# Patient Record
Sex: Female | Born: 1990 | Race: Black or African American | Hispanic: No | Marital: Single | Smoking: Never smoker
Health system: Southern US, Community
[De-identification: ages and names within clinical notes are randomized; demographics above are authoritative.]

## PROBLEM LIST (undated history)

## (undated) DIAGNOSIS — T7840XA Allergy, unspecified, initial encounter: Secondary | ICD-10-CM

## (undated) DIAGNOSIS — R9431 Abnormal electrocardiogram [ECG] [EKG]: Secondary | ICD-10-CM

## (undated) HISTORY — DX: Abnormal electrocardiogram (ECG) (EKG): R94.31

## (undated) HISTORY — PX: SHOULDER SURGERY: SHX246

## (undated) HISTORY — DX: Allergy, unspecified, initial encounter: T78.40XA

## (undated) HISTORY — PX: ACHILLES TENDON SURGERY: SHX542

---
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
## 2010-12-23 LAB — URINALYSIS, ROUTINE W REFLEX MICROSCOPIC
Bilirubin Urine: NEGATIVE
Glucose, UA: NEGATIVE
Hgb urine dipstick: NEGATIVE
Protein, ur: NEGATIVE

## 2010-12-23 LAB — WET PREP, GENITAL
Clue Cells Wet Prep HPF POC: NONE SEEN
Trich, Wet Prep: NONE SEEN

## 2010-12-23 LAB — POCT PREGNANCY, URINE
Operator id: 277751
Preg Test, Ur: NEGATIVE

## 2010-12-23 LAB — URINE MICROSCOPIC-ADD ON

## 2010-12-23 LAB — GC/CHLAMYDIA PROBE AMP, GENITAL
Chlamydia, DNA Probe: NEGATIVE
GC Probe Amp, Genital: POSITIVE — AB

## 2010-12-23 LAB — URINE CULTURE

## 2011-03-20 DIAGNOSIS — Z0000 Encounter for general adult medical examination without abnormal findings: Secondary | ICD-10-CM

## 2011-03-20 HISTORY — DX: Encounter for general adult medical examination without abnormal findings: Z00.00

## 2011-03-25 ENCOUNTER — Other Ambulatory Visit: Payer: Self-pay | Admitting: Orthopedic Surgery

## 2011-03-25 ENCOUNTER — Ambulatory Visit
Admission: RE | Admit: 2011-03-25 | Discharge: 2011-03-25 | Disposition: A | Discharge status: Home / Self Care | Payer: BC Managed Care – PPO | Source: Ambulatory Visit | Attending: Orthopedic Surgery | Admitting: Orthopedic Surgery

## 2011-03-25 DIAGNOSIS — S43006A Unspecified dislocation of unspecified shoulder joint, initial encounter: Secondary | ICD-10-CM

## 2011-06-15 ENCOUNTER — Emergency Department (HOSPITAL_COMMUNITY): Payer: BC Managed Care – PPO

## 2011-06-15 ENCOUNTER — Encounter (HOSPITAL_COMMUNITY): Payer: Self-pay | Admitting: Adult Health

## 2011-06-15 ENCOUNTER — Emergency Department (HOSPITAL_COMMUNITY)
Admission: EM | Admit: 2011-06-15 | Discharge: 2011-06-16 | Disposition: A | Discharge status: Home / Self Care | Payer: BC Managed Care – PPO | Attending: Emergency Medicine | Admitting: Emergency Medicine

## 2011-06-15 ENCOUNTER — Other Ambulatory Visit: Payer: Self-pay

## 2011-06-15 DIAGNOSIS — R072 Precordial pain: Secondary | ICD-10-CM | POA: Insufficient documentation

## 2011-06-15 DIAGNOSIS — R011 Cardiac murmur, unspecified: Secondary | ICD-10-CM | POA: Insufficient documentation

## 2011-06-15 MED ORDER — IOHEXOL 350 MG/ML SOLN
100.0000 mL | Freq: Once | INTRAVENOUS | Status: AC | PRN
  Administered 2011-06-15: 100 mL via INTRAVENOUS

## 2011-06-15 MED ORDER — OXYCODONE-ACETAMINOPHEN 5-325 MG PO TABS
1.0000 | ORAL_TABLET | Freq: Once | ORAL | Status: AC
  Administered 2011-06-15: 1 via ORAL
  Filled 2011-06-15: qty 1

## 2011-06-15 MED ORDER — ONDANSETRON HCL 4 MG/2ML IJ SOLN
4.0000 mg | Freq: Once | INTRAMUSCULAR | Status: DC
  Filled 2011-06-15: qty 2

## 2011-06-15 MED ORDER — IOHEXOL 300 MG/ML  SOLN: 100.0000 mL | Freq: Once | INTRAMUSCULAR | Status: AC | PRN

## 2011-06-15 MED ORDER — MORPHINE SULFATE 4 MG/ML IJ SOLN
4.0000 mg | Freq: Once | INTRAMUSCULAR | Status: DC
  Filled 2011-06-15: qty 1

## 2011-06-15 NOTE — ED Provider Notes (Signed)
History     CSN: [id number]  Arrival date & time 06/15/11  2017   First MD Initiated Contact with Patient 06/15/11 2049      Chief Complaint  Patient presents with  . Chest Pain    (Consider location/radiation/quality/duration/timing/severity/associated sxs/prior treatment) HPI Comments: Patient with a significant medical history presents emergency department with chief complaint of chest pain.  His pain is located substernally is rated at 9/10 in severity, does not radiate, is constant and pressure-like in nature with no associated symptoms including syncope, dizziness, diaphoresis, shortness of breath, or DOE.  Patient states the onset of chest pain was about a week ago and was intermittent in nature but has gradually worsened becoming constant as of today.  Patient states she is very athletic and played basketball.  She denies being evaluated by a cardiologist in the past.  She has no history of smoking, anxiety, asthma, seizures, syncope or family history of Marfan's.  Patient has no other complaints at this time.  Patient is a 21 y.o. female presenting with chest pain. The history is provided by the patient.  Chest Pain Pertinent negatives for primary symptoms include no fever, no fatigue, no shortness of breath, no cough, no wheezing, no palpitations, no abdominal pain, no nausea, no vomiting and no dizziness.  Pertinent negatives for associated symptoms include no diaphoresis.     History reviewed. No pertinent past medical history.  History reviewed. No pertinent past surgical history.  History reviewed. No pertinent family history.  History  Substance Use Topics  . Smoking status: Never Smoker   . Smokeless tobacco: Not on file  . Alcohol Use: No    OB History    Grav Para Term Preterm Abortions TAB SAB Ect Mult Living                  Review of Systems  Constitutional: Negative for fever, chills, diaphoresis, activity change, fatigue and unexpected weight change.   HENT: Negative for congestion, neck pain and neck stiffness.   Eyes: Negative for visual disturbance.  Respiratory: Negative for apnea, cough, chest tightness, shortness of breath, wheezing and stridor.   Cardiovascular: Positive for chest pain. Negative for palpitations and leg swelling.  Gastrointestinal: Negative for nausea, vomiting, abdominal pain, diarrhea and blood in stool.  Genitourinary: Negative for dysuria, urgency, hematuria and flank pain.  Musculoskeletal: Negative for myalgias, back pain and gait problem.  Skin: Negative for pallor.  Neurological: Negative for dizziness, syncope, light-headedness and headaches.  All other systems reviewed and are negative.    Allergies  Review of patient's allergies indicates no known allergies.  Home Medications   Current Outpatient Rx  Name Route Sig Dispense Refill  . IBUPROFEN 400 MG PO TABS Oral Take 400 mg by mouth every 6 (six) hours as needed. For pain      BP 118/78  Pulse 43  Resp 15  SpO2 100%  LMP 06/14/2011  Physical Exam  Nursing note and vitals reviewed. Constitutional: She appears well-developed and well-nourished. No distress.  HENT:  Head: Normocephalic and atraumatic.  Eyes: Conjunctivae and EOM are normal. Pupils are equal, round, and reactive to light.  Neck: Normal range of motion. Neck supple. Normal carotid pulses and no JVD present. Carotid bruit is not present. No rigidity. Normal range of motion present.  Cardiovascular: Normal rate, regular rhythm, S1 normal, S2 normal, normal heart sounds, intact distal pulses and normal pulses.  Exam reveals no gallop and no friction rub.   No murmur heard.  No pitting edema bilaterally, RRR, 2/5 systolic ejection murmur heard best at the apex of the heart, distal pulses intact, no carotid bruit or JVD.   Pulmonary/Chest: Effort normal and breath sounds normal. No accessory muscle usage or stridor. No respiratory distress. She exhibits no tenderness and no  bony tenderness.  Abdominal: Bowel sounds are normal.       Soft non tender. Non pulsatile aorta.   Skin: Skin is warm, dry and intact. No rash noted. She is not diaphoretic. No cyanosis. Nails show no clubbing.    ED Course  Procedures (including critical care time)  Labs Reviewed - No data to display Dg Chest 2 View  06/15/2011  *RADIOLOGY REPORT*  Clinical Data: Chest pain  CHEST - 2 VIEW  Comparison: None  Findings: Normal heart size, mediastinal contours, and pulmonary vascularity. Lungs minimally hyperaerated clear. No pleural effusion or pneumothorax. Bones unremarkable.  IMPRESSION: No acute abnormalities.  Original Report Authenticated By: Lollie Marrow, M.D.   Ct Angio Chest W/cm &/or Wo Cm  06/15/2011  *RADIOLOGY REPORT*  Clinical Data:  Chest pain  CT ANGIOGRAPHY CHEST AND ABDOMEN  Technique:  Multidetector CT imaging of the chest and abdomen was performed using the standard protocol during bolus administration of intravenous contrast.  Multiplanar CT image reconstructions including MIPs were obtained to evaluate the vascular anatomy.  Contrast: 1 OMNIPAQUE IOHEXOL 300 MG/ML IJ SOLN, OMNIPAQUE IOHEXOL 350 MG/ML IV SOLN  Comparison:   None.  CTA CHEST  No enlarged supraclavicular or axillary lymph nodes.  There is no enlarged mediastinal or hilar lymph nodes.  No pericardial or pleural effusion identified.  The pulmonary arteries are patent.  There is no evidence for an acute pulmonary embolus.  No pericardial or pleural effusions identified.  The lungs appear clear.  No airspace consolidation, atelectasis or pneumothorax present.  The tracheobronchial tree is normal.  Normal appearance of the thoracic aorta.  There is no evidence for dissection.  Review of the visualized bony structures is negative.  IMPRESSION:  1.  No acute findings.  There is no evidence for aortic dissection or acute pulmonary embolus.  CTA ABDOMEN  Findings:  Visualized lung bases are clear.  Liver is normal.   Spleen is normal.  Pancreas is normal.  The adrenal glands are normal.   The kidneys are normal.  Gallbladder is unremarkable by CT.  No biliary ductal dilation.  Stomach and visualized large and small bowel are unremarkable.  Abdominal aorta is normal in caliber.  No evidence for dissection.  No significant lymphadenopathy.  No free fluid or abnormal fluid collections.   Review of the MIP images confirms the above findings.  IMPRESSION:  1.  No evidence for abdominal aortic aneurysm or dissection.  Original Report Authenticated By: Rosealee Albee, M.D.   Ct Angio Abdomen W/cm &/or Wo Contrast  06/15/2011  *RADIOLOGY REPORT*  Clinical Data:  Chest pain  CT ANGIOGRAPHY CHEST AND ABDOMEN  Technique:  Multidetector CT imaging of the chest and abdomen was performed using the standard protocol during bolus administration of intravenous contrast.  Multiplanar CT image reconstructions including MIPs were obtained to evaluate the vascular anatomy.  Contrast: 1 OMNIPAQUE IOHEXOL 300 MG/ML IJ SOLN, OMNIPAQUE IOHEXOL 350 MG/ML IV SOLN  Comparison:   None.  CTA CHEST  No enlarged supraclavicular or axillary lymph nodes.  There is no enlarged mediastinal or hilar lymph nodes.  No pericardial or pleural effusion identified.  The pulmonary arteries are patent.  There is  no evidence for an acute pulmonary embolus.  No pericardial or pleural effusions identified.  The lungs appear clear.  No airspace consolidation, atelectasis or pneumothorax present.  The tracheobronchial tree is normal.  Normal appearance of the thoracic aorta.  There is no evidence for dissection.  Review of the visualized bony structures is negative.  IMPRESSION:  1.  No acute findings.  There is no evidence for aortic dissection or acute pulmonary embolus.  CTA ABDOMEN  Findings:  Visualized lung bases are clear.  Liver is normal.  Spleen is normal.  Pancreas is normal.  The adrenal glands are normal.   The kidneys are normal.  Gallbladder is  unremarkable by CT.  No biliary ductal dilation.  Stomach and visualized large and small bowel are unremarkable.  Abdominal aorta is normal in caliber.  No evidence for dissection.  No significant lymphadenopathy.  No free fluid or abnormal fluid collections.   Review of the MIP images confirms the above findings.  IMPRESSION:  1.  No evidence for abdominal aortic aneurysm or dissection.  Original Report Authenticated By: Rosealee Albee, M.D.     No diagnosis found.   Date: 06/15/2011  Rate: 45  Rhythm: normal sinus rhythm  QRS Axis: borderline right axis deviation   Intervals: normal  ST/T Wave abnormalities: normal  Conduction Disutrbances:none  Narrative Interpretation: LVH   Old EKG Reviewed: none available     MDM  Chest pain, concern for hypertrophic cardiomyopathy   Pts EKG, CTangio Chest/Abd and CXR have been reviewed without evidence of acute cardiopulmonary etiology, dissection or aneurysm.  Cardiology will be consult it to schedule followup and echo as an outpatient.  Patient has been instructed to have 0 exercise until she has had her cardiac evaluation. Pts pain managed in ED with Percocet.  Patient verbalizes understanding and is agreeable with plan.  Spoke with Dr. Terressa Koyanagi, cardiology  Who will see pt as an OP in his office this week for further evaluation.         Jaci Carrel, [place] 06/15/11 2337

## 2011-06-15 NOTE — Discharge Instructions (Signed)
CAll the heart doctor listed above tomorrow morning to schedule an appointment for further evaluation this week. Do not exercise until cleared by the cardiologist. Read instructions below for reasons to return to the Emergency Department.    Chest Pain (Nonspecific)  HOME CARE INSTRUCTIONS  For the next few days, avoid physical activities that bring on chest pain. Continue physical activities as directed.  Do not smoke cigarettes or drink alcohol until your symptoms are gone.  Only take over-the-counter or prescription medicine for pain, discomfort, or fever as directed by your caregiver.  Follow your caregiver's suggestions for further testing if your chest pain does not go away.  Keep any follow-up appointments you made. If you do not go to an appointment, you could develop lasting (chronic) problems with pain. If there is any problem keeping an appointment, you must call to reschedule.  SEEK MEDICAL CARE IF:  You think you are having problems from the medicine you are taking. Read your medicine instructions carefully.  Your chest pain does not go away, even after treatment.  You develop a rash with blisters on your chest.  SEEK IMMEDIATE MEDICAL CARE IF:  You have increased chest pain or pain that spreads to your arm, neck, jaw, back, or belly (abdomen).  You develop shortness of breath, an increasing cough, or you are coughing up blood.  You have episodes of syncope (passing out, black out)  You have severe back or abdominal pain, feel sick to your stomach (nauseous) or throw up (vomit).  You develop severe weakness, fainting, or chills.  You have an oral temperature above 102 F (38.9 C), not controlled by medicine.   THIS IS AN EMERGENCY. Do not wait to see if the pain will go away. Get medical help at once. Call your local emergency services (911 in [place] Do not drive yourself to the hospital.   RESOURCE GUIDE  Dental Problems  Patients with Medicaid: [facility] [phone] [address]                                           [phone] [address] Phone:  [phone]                                                  Phone:  [phone]  If unable to pay or uninsured, contact:  [facility] or [facility]. to become qualified for the adult dental clinic.  Chronic Pain Problems Contact [facility]  [phone] Patients need to be referred by their primary care doctor.  Insufficient Money for Medicine Contact [employer]:  call "211" or [facility] (404)775-5310.  No Primary Care Doctor Call [facility]  671-623-8399 Other agencies that provide inexpensive medical care    [facility]  [phone]    [facility]  [phone]    [facility]  [phone]    [facility]  [phone]    [facility]  [phone]    [facility]  [phone]  Psychological Services [facility] Behavioral Health  [phone] [facility]  [phone]  [facility]   [phone] (emergency services [phone])  Substance Abuse Resources [facility] and Drug Services  [phone] [facility] [phone]) [phone] [facility] [phone] Floyd[facility] Flock [phone] Residential & Outpatient Substance Abuse Program  [phone]  Abuse/Neglect [facility] Child Abuse Hotline 801-783-7928 [facility] Child Abuse Hotline 616-540-9551 (After Hours)  Emergency Shelter [facility] [phone]  Maternity Homes Room at the [place] [facility] [phone] [facility] [phone]  MRSA Hotline #:   (302) 061-1376    [facility] Resources  [facility] [place]     [employer]                          [employer] Dept. [address] [place]                       [address]      [address] [place] [address]  Blondell Reveal Phone:  [phone]                                   Phone:  [phone]                 Phone:  [phone]  [facility] Phone:  [phone]  [facility] Child Abuse Hotline 6695401027 314-284-1604 (After Hours)

## 2011-06-15 NOTE — ED Notes (Addendum)
Pt was seen in [place] and told she may have an enl;arged heart. C/o of chest pain that is decribed as pressure and is constant and weakness for for the last 6-8 hours. HR is 41-50. pt is an athlete. Denies nausea, denies SOB. EKG completed.  mentating well, answers all questions

## 2011-06-15 NOTE — ED Notes (Signed)
PA, Paz at bedside.

## 2011-06-27 ENCOUNTER — Ambulatory Visit (INDEPENDENT_AMBULATORY_CARE_PROVIDER_SITE_OTHER): Payer: BC Managed Care – PPO | Admitting: Cardiology

## 2011-06-27 ENCOUNTER — Other Ambulatory Visit: Payer: Self-pay

## 2011-06-27 ENCOUNTER — Encounter: Payer: Self-pay | Admitting: Cardiology

## 2011-06-27 ENCOUNTER — Ambulatory Visit (HOSPITAL_COMMUNITY): Payer: BC Managed Care – PPO | Attending: Cardiology

## 2011-06-27 DIAGNOSIS — R079 Chest pain, unspecified: Secondary | ICD-10-CM | POA: Insufficient documentation

## 2011-06-27 DIAGNOSIS — R072 Precordial pain: Secondary | ICD-10-CM

## 2011-06-27 LAB — LIPID PANEL
Total CHOL/HDL Ratio: 2
Triglycerides: 33 mg/dL (ref 0.0–149.0)

## 2011-06-27 NOTE — Progress Notes (Signed)
21 yo with no significant past medical history presents for evaluation of chest pain.  For about 1.5 weeks, she had episodes of chest pressure, like someone grabbing her chest.  These episodes would occur several times a day and last for about 30 minutes at a time.  No trigger (not related to exertion, no related to meals).  Usually she would be at rest when they would start.  No tachypalpitations.  No syncope or lightheadedness.  No orthopnea.  Pain was not pleuritic.  She finally came to the ER a couple of weeks ago.  CTA chest and abdomen showed no PE, dissection, or gallbladder pathology.  ECG was normal.  She was sent home to followup in this office.  Since discharge from the ER, she has had only mild chest pressure.  This will resolve if she takes an aspirin.  She feels better overall.   ECG: NSR, normal  PMH:  No significant medical problems  FH: No heart problems in her family that she knows.  No SCD.   SH: Nonsmoker, no drugs.  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   ROS: All systems reviewed and negative except as per HPI.   Current Outpatient Prescriptions  Medication Sig Dispense Refill  . ibuprofen (ADVIL,MOTRIN) 400 MG tablet Take 400 mg by mouth every 6 (six) hours as needed. For pain        BP 110/68  Pulse 51  Ht 5\' 8"  (1.727 m)  Wt 139 lb 6.4 oz (63.231 kg)  BMI 21.20 kg/m2  LMP 06/14/2011 General: NAD Neck: No JVD, no thyromegaly or thyroid nodule.  Lungs: Clear to auscultation bilaterally with normal respiratory effort. CV: Nondisplaced PMI.  Heart regular S1/S2, no S3/S4, no murmur.  No peripheral edema.  No carotid bruit.  Normal pedal pulses.  No chest wall tenderness.  Abdomen: Soft, nontender, no hepatosplenomegaly, no distention.  Skin: Intact without lesions or rashes.  Neurologic: Alert and oriented x 3.  Psych: Normal affect. Extremities: No clubbing or cyanosis.  HEENT: Normal.

## 2011-06-27 NOTE — Assessment & Plan Note (Signed)
Atypical chest pain.  Not with exertion.  Not pleuritic or associated with meals.  Better with NSAIDs and aspirin.  ECG is normal.  NO family history of cardiac disease of any sort that she can recall.  She is a Chemical engineer.  I will have her get an echocardiogram.  If her heart is structurally normal, it will be ok for her to return to basketball practice.  I suspect that her symptoms were noncardiac.    Patient eats a very high fat and cholesterol diet.  I am going to check her lipids today as well.

## 2011-06-27 NOTE — Patient Instructions (Signed)
Please have fasting blood work today  The current medical regimen is effective;  continue present plan and medications.  Your physician has requested that you have an echocardiogram. Echocardiography is a painless test that uses sound waves to create images of your heart. It provides your doctor with information about the size and shape of your heart and how well your heart's chambers and valves are working. This procedure takes approximately one hour. There are no restrictions for this procedure.  Follow up as needed

## 2011-07-01 NOTE — ED Provider Notes (Signed)
Medical screening examination/treatment/procedure(s) were performed by non-physician practitioner and as supervising physician I was immediately available for consultation/collaboration.  Raeford Razor, MD 07/01/11 [phone]

## 2011-11-17 ENCOUNTER — Ambulatory Visit (INDEPENDENT_AMBULATORY_CARE_PROVIDER_SITE_OTHER): Payer: BC Managed Care – PPO | Admitting: Family Medicine

## 2011-11-17 VITALS — BP 132/80 | HR 50 | Temp 98.7°F | Resp 16 | Ht 68.0 in | Wt 141.0 lb

## 2011-11-17 DIAGNOSIS — Z0000 Encounter for general adult medical examination without abnormal findings: Secondary | ICD-10-CM

## 2011-11-17 DIAGNOSIS — Z124 Encounter for screening for malignant neoplasm of cervix: Secondary | ICD-10-CM

## 2011-11-17 DIAGNOSIS — Z23 Encounter for immunization: Secondary | ICD-10-CM

## 2011-11-17 LAB — COMPREHENSIVE METABOLIC PANEL
ALT: 12 U/L (ref 0–35)
Albumin: 4.3 g/dL (ref 3.5–5.2)
CO2: 26 mEq/L (ref 19–32)
Calcium: 9.4 mg/dL (ref 8.4–10.5)
Chloride: 104 mEq/L (ref 96–112)
Potassium: 3.7 mEq/L (ref 3.5–5.3)
Sodium: 137 mEq/L (ref 135–145)
Total Bilirubin: 0.7 mg/dL (ref 0.3–1.2)
Total Protein: 8 g/dL (ref 6.0–8.3)

## 2011-11-17 LAB — POCT CBC
Hemoglobin: 14 g/dL (ref 12.2–16.2)
MPV: 10.5 fL (ref 0–99.8)
POC Granulocyte: 3.3 (ref 2–6.9)
POC MID %: 8.1 %M (ref 0–12)
RBC: 4.59 M/uL (ref 4.04–5.48)

## 2011-11-17 NOTE — Progress Notes (Signed)
[facility] [address], [place] [address] [phone]  Date:  11/17/2011   Name:  Shelly Delacruz   DOB:  Aug 05, 1990   MRN:  [id number]  PCP:  No primary provider on file.    Chief Complaint: CPE   History of Present Illness:  Shelly Delacruz is a 21 y.o. very pleasant female patient who presents with the following:  Here today for a CPE.  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  She will graduate this year- then she plans to continue playing basketball or working in the sports field.  She has no concerns and feels fine.  She would like to have a pap today, no history of abnormal pap  She is not sure of the date of her last tetanus shot- thinks her last shot was as a child.   She is not currently SA- last SA several months ago.    Patient Active Problem List  Diagnosis  . Chest pain    Past Medical History  Diagnosis Date  . Chest pain   . Abnormal EKG     No past surgical history on file.  History  Substance Use Topics  . Smoking status: Never Smoker   . Smokeless tobacco: Not on file  . Alcohol Use: No    Family History  Problem Relation Age of Onset  . Hypertension Mother     No Known Allergies  Medication list has been reviewed and updated.  Current Outpatient Prescriptions on File Prior to Visit  Medication Sig Dispense Refill  . ibuprofen (ADVIL,MOTRIN) 400 MG tablet Take 400 mg by mouth every 6 (six) hours as needed. For pain        Review of Systems:  As per HPI- otherwise negative.   Physical Examination: Filed Vitals:   11/17/11 0806  BP: 132/80  Pulse: 50  Temp: 98.7 F (37.1 C)  Resp: 16   Filed Vitals:   11/17/11 0806  Height: 5\' 8"  (1.727 m)  Weight: 141 lb (63.957 kg)   Body mass index is 21.44 kg/(m^2). Ideal Body Weight: Weight in (lb) to have BMI = 25: 164.1   GEN: WDWN, NAD, Non-toxic, A & O x 3 HEENT: Atraumatic, Normocephalic. Neck supple. No masses, No LAD. PEERL, EOMI, TM and  oropharynx wnl.   Ears and Nose: No external deformity. CV: RRR, No M/G/R. No JVD. No thrill. No extra heart sounds. PULM: CTA B, no wheezes, crackles, rhonchi. No retractions. No resp. distress. No accessory muscle use. ABD: S, NT, ND, +BS. No rebound. No HSM. Breast Exam: normal, no masses or discharge GU: normal exam, normal external and internal genitalia, no CMT, no adnexal masses or tenderness, normal uterus.   EXTR: No c/c/e NEURO Normal gait.  PSYCH: Normally interactive. Conversant. Not depressed or anxious appearing.  Calm demeanor.    Assessment and Plan: 1. Physical exam, annual  POCT CBC, Comprehensive metabolic panel  2. Screening for cervical cancer  Pap IG, CT/NG w/ reflex HPV when ASC-U  3. Need for diphtheria-tetanus-pertussis (Tdap) vaccine, adult/adolescent  Tdap vaccine greater than or equal to 7yo IM   PE for age today.  Declined contraceptive Rx today.   Update Tdap. Labs pending as above- FLP done by cardiology in April of this year.  Looked great.  Will plan further follow- up pending labs. We wish Shelly Delacruz the best of luck in her senior year!    Abbe Amsterdam, MD

## 2011-11-18 ENCOUNTER — Encounter: Payer: Self-pay | Admitting: Family Medicine

## 2011-11-21 ENCOUNTER — Encounter: Payer: Self-pay | Admitting: Family Medicine

## 2011-11-21 LAB — PAP IG, CT-NG, RFX HPV ASCU: Chlamydia Probe Amp: NEGATIVE

## 2012-12-07 ENCOUNTER — Ambulatory Visit (INDEPENDENT_AMBULATORY_CARE_PROVIDER_SITE_OTHER): Payer: BC Managed Care – PPO | Admitting: Physician Assistant

## 2012-12-07 VITALS — BP 108/62 | HR 46 | Temp 98.0°F | Resp 18 | Ht 67.0 in | Wt 145.0 lb

## 2012-12-07 DIAGNOSIS — Z0000 Encounter for general adult medical examination without abnormal findings: Secondary | ICD-10-CM

## 2012-12-07 LAB — POCT URINALYSIS DIPSTICK
Ketones, UA: NEGATIVE
Leukocytes, UA: NEGATIVE
Protein, UA: NEGATIVE
pH, UA: 5.5

## 2012-12-07 NOTE — Patient Instructions (Addendum)

## 2012-12-07 NOTE — Progress Notes (Signed)
Subjective:    Patient ID: MONICK RENA, female    DOB: 1991/03/01, 22 y.o.   MRN: [id number]  HPI   Ms. Mariner is a 22 yr old female here for CPE.  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 Diet:  Mornings - oatmeal or egs; lunch - noodles; dinner - fish, rice; not as many vegetables as she should; only drinks water Exercise:  Run in AM, goes to gym Meds:  none Family history:  Mom - HTN, HLD; dad deceased of liver or kidney problems No smoking or substances Etoh - about once per week or every other week; drinks "til I get drunk" - 3 mixed drinks and 3 beers  Last cr slightly ^ was told to recheck but did not - thinks father may have had kidney disease but cannot remember  1 episode of CP last year - was evaluated by cards, no diagnosis, no recurrence of symptoms  College in [place], [place]  Review of Systems  Constitutional: Negative.   HENT: Negative.   Eyes: Negative.   Respiratory: Negative.   Cardiovascular: Negative.   Gastrointestinal: Negative.   Genitourinary: Negative.   Musculoskeletal: Negative.   Skin: Negative.   Neurological: Negative.        Objective:   Physical Exam  Vitals reviewed. Constitutional: She is oriented to person, place, and time. She appears well-developed and well-nourished. No distress.  HENT:  Head: Normocephalic and atraumatic.  Right Ear: Tympanic membrane and ear canal normal.  Left Ear: Tympanic membrane and ear canal normal.  Mouth/Throat: Uvula is midline, oropharynx is clear and moist and mucous membranes are normal.  Eyes: Conjunctivae and EOM are normal. Pupils  are equal, round, and reactive to light. No scleral icterus.  Neck: Normal range of motion. Neck supple. No thyromegaly present.  Cardiovascular: Normal rate, regular rhythm and normal heart sounds.   Pulmonary/Chest: Effort normal and breath sounds normal. She has no wheezes. She has no rales.  Abdominal: Soft. Bowel sounds are normal. There is no tenderness.  Musculoskeletal: Normal range of motion.  Lymphadenopathy:    She has no cervical adenopathy.  Neurological: She is alert and oriented to person, place, and time. She has normal reflexes.  Skin: Skin is warm and dry.  Psychiatric: She has a normal mood and affect. Her behavior is normal.     Results for orders placed in visit on 12/07/12  POCT URINALYSIS DIPSTICK      Result Value Range   Color, UA yellow     Clarity, UA clear     Glucose, UA neg     Bilirubin, UA neg     Ketones, UA neg     Spec Grav, UA >=1.030     Blood, UA neg     pH, UA 5.5     Protein, UA neg     Urobilinogen, UA 0.2     Nitrite, UA neg     Leukocytes, UA Negative          Assessment & Plan:  Routine general medical examination at a health care facility - Plan: POCT urinalysis dipstick, Comprehensive metabolic panel  Ms. Bessler is a pleasant 22 yr old female here for CPE.  She appears to be in good health and exam is normal.  With normal pap last year and no vaginal symptoms, pelvic exam deferred.  Labs all WNL 1 yr ago, though Cr slightly elevated.  Pt thinks father may have had kidney disease.  Will repeat CMP today - if elevated might consider eval with nephro considering fam hx.  UA is normal today.  Discussed anticipatory guidance and health maintenance.  RTC as concerns arise.

## 2012-12-08 LAB — COMPREHENSIVE METABOLIC PANEL
ALT: 14 U/L (ref 0–35)
CO2: 26 mEq/L (ref 19–32)
Creat: 1.01 mg/dL (ref 0.50–1.10)
Total Bilirubin: 0.6 mg/dL (ref 0.3–1.2)

## 2013-01-25 ENCOUNTER — Ambulatory Visit: Payer: BC Managed Care – PPO

## 2013-01-25 ENCOUNTER — Ambulatory Visit (INDEPENDENT_AMBULATORY_CARE_PROVIDER_SITE_OTHER): Payer: BC Managed Care – PPO | Admitting: Emergency Medicine

## 2013-01-25 VITALS — BP 122/88 | HR 52 | Temp 97.7°F | Resp 16 | Ht 67.0 in | Wt 150.8 lb

## 2013-01-25 DIAGNOSIS — M24411 Recurrent dislocation, right shoulder: Secondary | ICD-10-CM

## 2013-01-25 DIAGNOSIS — M24419 Recurrent dislocation, unspecified shoulder: Secondary | ICD-10-CM

## 2013-01-25 MED ORDER — NAPROXEN SODIUM 550 MG PO TABS: 550.0000 mg | ORAL_TABLET | Freq: Two times a day (BID) | ORAL | Status: DC

## 2013-01-25 NOTE — Patient Instructions (Signed)

## 2013-01-25 NOTE — Progress Notes (Signed)
[facility] [address], [place] [address] [phone]  Date:  01/25/2013   Name:  AQSA SENSABAUGH   DOB:  18-Oct-1990   MRN:  [id number]  PCP:  No primary provider on file.    Chief Complaint: Shoulder Pain   History of Present Illness:  Shelly Delacruz is a 22 y.o. very pleasant female patient who presents with the following:  Recurrent injury to her right shoulder.  Says it has "popped out" three times in past year while playing basketball.  She reduced it herself twice and her trainer did once.  Last time was two weeks ago.  No improvement with over the counter medications or other home remedies. Denies other complaint or health concern today.   Patient Active Problem List   Diagnosis Date Noted  . Chest pain 06/27/2011    Past Medical History  Diagnosis Date  . Chest pain   . Abnormal EKG     History reviewed. No pertinent past surgical history.  History  Substance Use Topics  . Smoking status: Never Smoker   . Smokeless tobacco: Not on file  . Alcohol Use: 3.3 oz/week    3 Cans of beer, 3 Drinks containing 0.5 oz of alcohol per week    Family History  Problem Relation Age of Onset  . Hypertension Mother   . Hyperlipidemia Mother     No Known Allergies  Medication list has been reviewed and updated.  Current Outpatient Prescriptions on File Prior to Visit  Medication Sig Dispense Refill  . ibuprofen (ADVIL,MOTRIN) 400 MG tablet Take 400 mg by mouth every 6 (six) hours as needed. For pain       No current facility-administered medications on file prior to visit.    Review of Systems:  As per HPI, otherwise negative.    Physical Examination: Filed Vitals:   01/25/13 1005  BP: 122/88  Pulse: 52  Temp: 97.7 F (36.5 C)  Resp: 16   Filed Vitals:   01/25/13 1005  Height: 5\' 7"  (1.702 m)  Weight: 150 lb 12.8 oz (68.402 kg)   Body mass index is 23.61 kg/(m^2). Ideal Body Weight: Weight in (lb) to have BMI = 25:  159.3   GEN: WDWN, NAD, Non-toxic, Alert & Oriented x 3 HEENT: Atraumatic, Normocephalic.  Ears and Nose: No external deformity. EXTR: No clubbing/cyanosis/edema NEURO: Normal gait.  PSYCH: Normally interactive. Conversant. Not depressed or anxious appearing.  Calm demeanor.  RIGHT shoulder:    Assessment and Plan: Recurrent shoulder dislocation Ortho No play or practice until ortho clearance   Signed,  Phillips Odor, MD   [facility] reading (PRIMARY) by  Dr. Dareen Piano.  Negative .

## 2013-11-21 ENCOUNTER — Ambulatory Visit (INDEPENDENT_AMBULATORY_CARE_PROVIDER_SITE_OTHER): Payer: BC Managed Care – PPO | Admitting: Family Medicine

## 2013-11-21 ENCOUNTER — Ambulatory Visit (INDEPENDENT_AMBULATORY_CARE_PROVIDER_SITE_OTHER): Payer: BC Managed Care – PPO

## 2013-11-21 VITALS — BP 118/68 | HR 55 | Temp 98.4°F | Resp 16 | Ht 67.0 in | Wt 147.4 lb

## 2013-11-21 DIAGNOSIS — M25511 Pain in right shoulder: Secondary | ICD-10-CM

## 2013-11-21 DIAGNOSIS — M25519 Pain in unspecified shoulder: Secondary | ICD-10-CM

## 2013-11-21 NOTE — Progress Notes (Signed)
This chart was scribed for Shelly Sidle, MD by Charline Bills, ED Scribe. The patient was seen in room 13. Patient's care was started at 9:22 AM.  Patient ID: Shelly Delacruz MRN: [id number], DOB: 25-Apr-1990, 23 y.o. Date of Encounter: 11/21/2013, 9:22 AM  Primary Physician: No PCP Per Patient  Chief Complaint  Patient presents with   Shoulder Injury    playing basketball this morning and right shoulder/arm extended back    HPI: 23 y.o. year old female with history below presents with R shoulder injury PTA. Pt states that she was playing basketball this morning when she dislocated her shoulder. Pt states that she was able to place her joint back into place. Pt reports 4-5 previous episodes of the same injury.   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 She plans to play basketball over-seas.   Past Medical History  Diagnosis Date   Chest pain    Abnormal EKG      Home Meds: Prior to Admission medications   Medication Sig Start Date End Date Taking? Authorizing Provider  ibuprofen (ADVIL,MOTRIN) 400 MG tablet Take 400 mg by mouth every 6 (six) hours as needed. For pain   Yes Historical Provider, MD    Allergies: No Known Allergies  History   Social History   Marital Status: Single    Spouse Name: N/A    Number of Children: N/A   Years of Education: N/A   Occupational History   Not on file.   Social History Main Topics   Smoking status: Never Smoker    Smokeless tobacco: Not on file   Alcohol Use: 3.3 oz/week    3 Cans of beer, 3 Drinks containing 0.5 oz of alcohol per week   Drug Use: No   Sexual Activity: Yes    Copy: None     Comment: female partners   Other Topics Concern   Not on file   Social History Narrative   No narrative on file     Review of Systems: Constitutional: negative for chills, fever, night sweats, weight changes, or fatigue  HEENT: negative for vision changes, hearing loss, congestion,  rhinorrhea, ST, epistaxis, or sinus pressure Cardiovascular: negative for chest pain or palpitations Respiratory: negative for hemoptysis, wheezing, shortness of breath, or cough Abdominal: negative for abdominal pain, nausea, vomiting, diarrhea, or constipation Dermatological: negative for rash Neurologic: negative for headache, dizziness, or syncope All other systems reviewed and are otherwise negative with the exception to those above and in the HPI.   Physical Exam: Blood pressure 118/68, pulse 55, temperature 98.4 F (36.9 C), temperature source Oral, resp. rate 16, height  (1.702 m), weight 147 lb 6.4 oz (66.86 kg), last menstrual period 10/24/2013, SpO2 100.00%., Body mass index is 23.08 kg/(m^2). General: Well developed, well nourished, in no acute distress. Head: Normocephalic, atraumatic, EOMI, PERRL, Oral cavity moist.  Neck: Supple. No thyromegaly. Full ROM. No lymphadenopathy. Lungs: Clear bilaterally to auscultation without wheezes, rales, or rhonchi. Breathing is unlabored. Heart: Normal rate Abdomen: Soft, non-tender, non-distended with normoactive bowel sounds.  Msk:  Strength and tone normal for age, full ROM in R shoulder Extremities/Skin: Warm and dry. No rashes or suspicious lesions. Neuro: Alert and oriented X 3. Moves all extremities spontaneously. Gait is normal. CNII-XII grossly in tact. Psych:  Responds to questions appropriately with a normal affect.   [facility] reading (PRIMARY) by  Dr. Milus Glazier:  Normal right shoulder.    ASSESSMENT AND PLAN:  23 y.o.  year old female with   1. Pain in joint, shoulder region, right    Pain in joint, shoulder region, right - Plan: DG Shoulder Right, Ambulatory referral to Orthopedic Surgery   Signed, Shelly Sidle, MD 11/21/2013 9:23 AM

## 2013-11-22 ENCOUNTER — Telehealth: Payer: Self-pay

## 2013-11-22 NOTE — Telephone Encounter (Signed)
Pt will be calling Dr. Celene Skeen office to reschedule her appt. Advised pt to RTC if needed to be seen sooner.

## 2013-11-22 NOTE — Telephone Encounter (Signed)
Patient requesting to be referred to another orthopaedic doctor for right shoulder. Patient no longer wants to see Dr. Chestine Spore an orthopaedic she had seen before. Patients call back number is [phone]

## 2014-05-19 ENCOUNTER — Ambulatory Visit (INDEPENDENT_AMBULATORY_CARE_PROVIDER_SITE_OTHER): Payer: BC Managed Care – PPO | Admitting: Physician Assistant

## 2014-05-19 VITALS — BP 110/70 | HR 56 | Temp 98.3°F | Resp 16 | Ht 67.5 in | Wt 151.2 lb

## 2014-05-19 DIAGNOSIS — H6092 Unspecified otitis externa, left ear: Secondary | ICD-10-CM

## 2014-05-19 DIAGNOSIS — H60332 Swimmer's ear, left ear: Secondary | ICD-10-CM

## 2014-05-19 MED ORDER — NAPROXEN 500 MG PO TABS: 500.0000 mg | ORAL_TABLET | Freq: Two times a day (BID) | ORAL | Status: DC

## 2014-05-19 MED ORDER — ACETAMINOPHEN 500 MG PO TABS: 1000.0000 mg | ORAL_TABLET | Freq: Three times a day (TID) | ORAL | Status: DC | PRN

## 2014-05-19 MED ORDER — CIPROFLOXACIN HCL 0.2 % OT SOLN: 0.2000 mL | Freq: Two times a day (BID) | OTIC | Status: AC

## 2014-05-19 NOTE — Progress Notes (Signed)
   05/19/2014 at 7:31 PM  Shelly CirriLindsay L Delacruz / DOB: [birth year] / MRN: [id number]  The patient has Chest pain on her problem list.  SUBJECTIVE  Chief compalaint: left ear pain   History of present illness: Shelly Delacruz is 24 y.o. well appearing female presenting for worsening moderate ear pain that started 3 days ago. Associated symptoms include ear tenderness and she denies changes in hearing, jaw pain and ear discharge. She has tried ear lavage with poor relief.  She  has a past medical history of Chest pain and Abnormal EKG.    She  has a current medication list which includes the following prescription(s): ibuprofen, acetaminophen, ciprofloxacin hcl, and naproxen.  Shelly Delacruz has No Known Allergies. She  reports that she has never smoked. She does not have any smokeless tobacco history on file. She reports that she drinks about 3.3 oz of alcohol per week. She reports that she does not use illicit drugs. She  reports that she currently engages in sexual activity. She reports using the following method of birth control/protection: None. The patient  has no past surgical history on file.  Her family history includes Hyperlipidemia in her mother; Hypertension in her mother.  Review of Systems  Constitutional: Negative for fever, chills and weight loss.  HENT: Positive for ear pain. Negative for congestion, ear discharge, hearing loss, sore throat and tinnitus.   Respiratory: Negative for cough.   Cardiovascular: Negative for chest pain and palpitations.  Gastrointestinal: Negative for nausea, vomiting and abdominal pain.  Genitourinary: Negative for dysuria.  Neurological: Negative for headaches.    OBJECTIVE  Her  height is 5' 7.5" (1.715 m) and weight is 151 lb 3.2 oz (68.584 kg). Her oral temperature is 98.3 F (36.8 C). Her blood pressure is 110/70 and her pulse is 56. Her respiration is 16 and oxygen saturation is 99%.  The patient's body mass index is 23.32 kg/(m^2).  Physical Exam    Constitutional: She appears well-developed and well-nourished.  HENT:  Right Ear: Hearing, tympanic membrane, external ear and ear canal normal.  Left Ear: There is swelling and tenderness. Tympanic membrane is not injected, not erythematous and not bulging. No decreased hearing is noted.  Nose: Nose normal.  Mouth/Throat: Uvula is midline, oropharynx is clear and moist and mucous membranes are normal.    No results found for this or any previous visit (from the past 24 hour(s)).  ASSESSMENT & PLAN  Shelly Delacruz was seen today for left ear pain.  Diagnoses and all orders for this visit:  Swimmer's ear, left  Other orders -     Ciprofloxacin HCl 0.2 % otic solution; Place 0.2 mLs into the left ear 2 (two) times daily. -     naproxen (NAPROSYN) 500 MG tablet; Take 1 tablet (500 mg total) by mouth 2 (two) times daily with a meal. -     acetaminophen (TYLENOL) 500 MG tablet; Take 2 tablets (1,000 mg total) by mouth every 8 (eight) hours as needed. Take 2 tabs every 8 hours.   The patient was advised to call or come back to clinic if she does not see an improvement in symptoms, or worsens with the above plan.   Deliah BostonMichael Clark, MHS, PA-C [facility] 05/19/2014 7:31 PM

## 2015-05-03 ENCOUNTER — Ambulatory Visit (INDEPENDENT_AMBULATORY_CARE_PROVIDER_SITE_OTHER): Payer: BC Managed Care – PPO | Admitting: Family Medicine

## 2015-05-03 VITALS — BP 120/76 | HR 55 | Temp 98.4°F | Resp 14 | Ht 67.75 in | Wt 154.8 lb

## 2015-05-03 DIAGNOSIS — R040 Epistaxis: Secondary | ICD-10-CM

## 2015-05-03 DIAGNOSIS — J4521 Mild intermittent asthma with (acute) exacerbation: Secondary | ICD-10-CM

## 2015-05-03 DIAGNOSIS — J683 Other acute and subacute respiratory conditions due to chemicals, gases, fumes and vapors: Secondary | ICD-10-CM

## 2015-05-03 MED ORDER — ALBUTEROL SULFATE 108 (90 BASE) MCG/ACT IN AEPB: 2.0000 | INHALATION_SPRAY | Freq: Four times a day (QID) | RESPIRATORY_TRACT | Status: DC | PRN

## 2015-05-03 NOTE — Patient Instructions (Signed)
You have reactive airways which is a mild form of allergic asthma   Nosebleed Nosebleeds are common. They are due to a crack in the inside lining of your nose (mucous membrane) or from a small blood vessel that starts to bleed. Nosebleeds can be caused by many conditions, such as injury, infections, dry mucous membranes or dry climate, medicines, nose picking, and home heating and cooling systems. Most nosebleeds come from blood vessels in the front of your nose. HOME CARE INSTRUCTIONS   Try controlling your nosebleed by pinching your nostrils gently and continuously for at least 10 minutes.  Avoid blowing or sniffing your nose for a number of hours after having a nosebleed.  Do not put gauze inside your nose yourself. If your nose was packed by your health care provider, try to maintain the pack inside of your nose until your health care provider removes it.  If a gauze pack was used and it starts to fall out, gently replace it or cut off the end of it.  If a balloon catheter was used to pack your nose, do not cut or remove it unless your health care provider has instructed you to do that.  Avoid lying down while you are having a nosebleed. Sit up and lean forward.  Use a nasal spray decongestant to help with a nosebleed as directed by your health care provider.  Do not use petroleum jelly or mineral oil in your nose. These can drip into your lungs.  Maintain humidity in your home by using less air conditioning or by using a humidifier.  Aspirinand blood thinners make bleeding more likely. If you are prescribed these medicines and you suffer from nosebleeds, ask your health care provider if you should stop taking the medicines or adjust the dose. Do not stop medicines unless directed by your health care provider  Resume your normal activities as you are able, but avoid straining, lifting, or bending at the waist for several days.  If your nosebleed was caused by dry mucous membranes,  use over-the-counter saline nasal spray or gel. This will keep the mucous membranes moist and allow them to heal. If you must use a lubricant, choose the water-soluble variety. Use it only sparingly, and do not use it within several hours of lying down.  Keep all follow-up visits as directed by your health care provider. This is important. SEEK MEDICAL CARE IF:  You have a fever.  You get frequent nosebleeds.  You are getting nosebleeds more often. SEEK IMMEDIATE MEDICAL CARE IF:  Your nosebleed lasts longer than 20 minutes.  Your nosebleed occurs after an injury to your face, and your nose looks crooked or broken.  You have unusual bleeding from other parts of your body.  You have unusual bruising on other parts of your body.  You feel light-headed or you faint.  You become sweaty.  You vomit blood.  Your nosebleed occurs after a head injury.   This information is not intended to replace advice given to you by your health care provider. Make sure you discuss any questions you have with your health care provider.   Document Released: 12/22/2004 Document Revised: 04/04/2014 Document Reviewed: 10/28/2013 Elsevier Interactive Patient Education [employer]

## 2015-05-03 NOTE — Progress Notes (Signed)
By signing my name below, I, [name], attest that this documentation has been prepared under the direction and in the presence of Elvina Sidle, MD. Electronically Signed: [name], Scribe. 05/03/2015 , 4:04 PM .  Patient was seen in room 8 .   Patient ID: Shelly Delacruz MRN: [id number], DOB: 08-24-1990, 24 y.o. Date of Encounter: 05/03/2015  Primary Physician: No PCP Per Patient  Chief Complaint:  Chief Complaint  Patient presents with  . Headache  . Chest Pain  . Epistaxis    HPI:  Shelly Delacruz is a 25 y.o. female who [facility] complaining of frequent epistaxis with chest pain and headache. She used to have nose bleeds when she was younger due to dry air. She denies history of asthma. She does have seasonal allergies.   She works at the [employer]. She also works at [employer].   Past Medical History  Diagnosis Date  . Chest pain   . Abnormal EKG      Home Meds: Prior to Admission medications   Medication Sig Start Date End Date Taking? Authorizing Provider  acetaminophen (TYLENOL) 500 MG tablet Take 2 tablets (1,000 mg total) by mouth every 8 (eight) hours as needed. Take 2 tabs every 8 hours. Patient not taking: Reported on 05/03/2015 05/19/14   Ofilia Neas, PA-C  naproxen (NAPROSYN) 500 MG tablet Take 1 tablet (500 mg total) by mouth 2 (two) times daily with a meal. Patient not taking: Reported on 05/03/2015 05/19/14   Ofilia Neas, PA-C    Allergies: No Known Allergies  Social History   Social History  . Marital Status: Single    Spouse Name: N/A  . Number of Children: N/A  . Years of Education: N/A   Occupational History  . Not on file.   Social History Main Topics  . Smoking status: Never Smoker   . Smokeless tobacco: Not on file  . Alcohol Use: 3.3 oz/week    3 Cans of beer, 3 Standard drinks or equivalent per week  . Drug Use: No  . Sexual Activity: Yes    Birth Control/ Protection: None   Comment: female partners   Other Topics Concern  . Not on file   Social History Narrative     Review of Systems: Constitutional: negative for fever, chills, night sweats, weight changes, or fatigue  HEENT: negative for vision changes, hearing loss, congestion, rhinorrhea, ST, or sinus pressure; positive for epistaxis Cardiovascular: negative for palpitations; positive for chest pain Respiratory: negative for hemoptysis, wheezing, shortness of breath, or cough Abdominal: negative for abdominal pain, nausea, vomiting, diarrhea, or constipation Dermatological: negative for rash Neurologic: negative for dizziness, or syncope; positive for headache All other systems reviewed and are otherwise negative with the exception to those above and in the HPI.  Physical Exam: Blood pressure 120/76, pulse 55, temperature 98.4 F (36.9 C), temperature source Oral, resp. rate 14, height 5' 7.75" (1.721 m), weight 154 lb 12.8 oz (70.217 kg), last menstrual period 04/15/2015, SpO2 99 %., Body mass index is 23.71 kg/(m^2). General: Well developed, well nourished, in no acute distress. Head: Normocephalic, atraumatic, eyes without discharge, sclera non-icteric. Bilateral auditory canals clear, TM's are without perforation, pearly grey and translucent with reflective cone of light bilaterally. Oral cavity moist, posterior pharynx without exudate, erythema, peritonsillar abscess, or post nasal drip. Superficial blood vessels on septum Neck: Supple. No thyromegaly. Full ROM. No lymphadenopathy. Lungs: Clear bilaterally to auscultation without wheezes, rales, or  rhonchi. Breathing is unlabored. Heart: RRR with S1 S2. No murmurs, rubs, or gallops appreciated. Abdomen: Soft, non-tender, non-distended with normoactive bowel sounds. No hepatomegaly. No rebound/guarding. No obvious abdominal masses. Msk:  Strength and tone normal for age. Extremities/Skin: Warm and dry. No clubbing or cyanosis. No edema. No rashes or  suspicious lesions. Neuro: Alert and oriented X 3. Moves all extremities spontaneously. Gait is normal. CNII-XII grossly in tact. Psych:  Responds to questions appropriately with a normal affect.   Septum of nose was cauterized in both sides with silver nitrate. Patient tolerated the procedure well. ASSESSMENT AND PLAN:  25 y.o. year old female with  This chart was scribed in my presence and reviewed by me personally.    ICD-9-CM ICD-10-CM   1. Epistaxis 784.7 R04.0   2. Reactive airways dysfunction syndrome, mild intermittent, with acute exacerbation 493.92 J45.21 Albuterol Sulfate (PROAIR RESPICLICK) 108 (90 Base) MCG/ACT AEPB     Signed, Elvina Sidle, MD     Signed, Elvina Sidle, MD 05/03/2015 4:04 PM

## 2015-05-29 ENCOUNTER — Ambulatory Visit (INDEPENDENT_AMBULATORY_CARE_PROVIDER_SITE_OTHER): Payer: BC Managed Care – PPO | Admitting: Physician Assistant

## 2015-05-29 VITALS — BP 108/64 | HR 60 | Temp 98.4°F | Resp 16 | Ht 67.0 in | Wt 150.0 lb

## 2015-05-29 DIAGNOSIS — E010 Iodine-deficiency related diffuse (endemic) goiter: Secondary | ICD-10-CM

## 2015-05-29 DIAGNOSIS — Z0000 Encounter for general adult medical examination without abnormal findings: Secondary | ICD-10-CM

## 2015-05-29 DIAGNOSIS — N63 Unspecified lump in unspecified breast: Secondary | ICD-10-CM

## 2015-05-29 DIAGNOSIS — E049 Nontoxic goiter, unspecified: Secondary | ICD-10-CM | POA: Diagnosis not present

## 2015-05-29 DIAGNOSIS — Z124 Encounter for screening for malignant neoplasm of cervix: Secondary | ICD-10-CM | POA: Diagnosis not present

## 2015-05-29 LAB — CBC
HCT: 39.6 % (ref 36.0–46.0)
HEMOGLOBIN: 13 g/dL (ref 12.0–15.0)
MCH: 31.4 pg (ref 26.0–34.0)
MCHC: 32.8 g/dL (ref 30.0–36.0)
MCV: 95.7 fL (ref 78.0–100.0)
MPV: 10.9 fL (ref 8.6–12.4)
Platelets: 232 10*3/uL (ref 150–400)
RBC: 4.14 MIL/uL (ref 3.87–5.11)
RDW: 13.2 % (ref 11.5–15.5)
WBC: 6.3 10*3/uL (ref 4.0–10.5)

## 2015-05-29 LAB — COMPREHENSIVE METABOLIC PANEL
ALBUMIN: 4.4 g/dL (ref 3.6–5.1)
ALK PHOS: 51 U/L (ref 33–115)
ALT: 14 U/L (ref 6–29)
AST: 16 U/L (ref 10–30)
BILIRUBIN TOTAL: 0.4 mg/dL (ref 0.2–1.2)
BUN: 22 mg/dL (ref 7–25)
CO2: 24 mmol/L (ref 20–31)
Calcium: 9.3 mg/dL (ref 8.6–10.2)
Chloride: 106 mmol/L (ref 98–110)
Creat: 0.91 mg/dL (ref 0.50–1.10)
GLUCOSE: 78 mg/dL (ref 65–99)
Potassium: 4 mmol/L (ref 3.5–5.3)
Sodium: 138 mmol/L (ref 135–146)
TOTAL PROTEIN: 7.6 g/dL (ref 6.1–8.1)

## 2015-05-29 LAB — TSH: TSH: 0.54 mIU/L

## 2015-05-29 NOTE — Patient Instructions (Addendum)
I will call you with results of your lab tests. You will get a phone call to make appt for breast and thyroid ultrasounds Monitor lip lesion, if does not get better in 1 week or if grows/changes -- follow up with dentist. Return as needed.

## 2015-05-29 NOTE — Progress Notes (Signed)
[facility] [address], [place] [id number] [phone]  Date:  05/29/2015   Name:  Shelly Delacruz   DOB:  1991-01-08   MRN:  [id number]  PCP:  No PCP Per Patient    Chief Complaint: Annual Exam   History of Present Illness:  This is a 25 y.o. female who is presenting for CPE.  surg hx: right rotator cuff surgery 2015. Did 6 months PT and no problems now. No other PMH. 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 Needing form filled out  Complaining of a lip lesion she noted 2 days ago. No pain.  LMP: 05/04/15. Periods every 5 weeks or so. No chance she could be pregnant. Last pap: 2011. Normal.  Sexual history: Last had STD testing 2 weeks ago, hasn't heard back yet. Sexually active, no new partners. Sex with women only. Immunizations: does not get flu shots. Dentist: 6 months ago. Eye: no vision problems. Does not wear glasses or contacts. Diet/Exercise: eats healthy, plays on a semi-pro basketball team Fam hx: No hx DM, CAD, MI, cancers. Tobacco/alcohol/substance use: no/occ/no  Review of Systems:  Review of Systems  Constitutional: Negative.   HENT: Positive for mouth sores.   Eyes: Negative.   Respiratory: Negative.   Cardiovascular: Negative.   Gastrointestinal: Negative.   Endocrine: Negative.   Genitourinary: Negative.   Musculoskeletal: Negative.   Skin: Negative.   Allergic/Immunologic: Positive for environmental allergies.  Neurological: Negative.   Hematological: Negative.   Psychiatric/Behavioral: Negative.    There are no active problems to display for this patient.   Prior to Admission medications   Medication Sig Start Date End Date Taking? Authorizing Provider  Albuterol Sulfate (PROAIR RESPICLICK) 108 (90 Base) MCG/ACT AEPB Inhale 2 puffs into the lungs every 6 (six) hours as needed. Patient not taking: Reported on 05/29/2015 05/03/15   Elvina SidleKurt Lauenstein, MD    No Known Allergies  History reviewed.  No pertinent past surgical history.  Social History  Substance Use Topics  . Smoking status: Never Smoker   . Smokeless tobacco: None  . Alcohol Use: 3.3 oz/week    3 Cans of beer, 3 Standard drinks or equivalent per week    Family History  Problem Relation Age of Onset  . Hypertension Mother   . Hyperlipidemia Mother     Medication list has been reviewed and updated.  Physical Examination:  Physical Exam  Constitutional: She is oriented to person, place, and time.  HENT:  Head: Normocephalic and atraumatic.  Right Ear: Hearing, tympanic membrane, external ear and ear canal normal.  Left Ear: Hearing, tympanic membrane, external ear and ear canal normal.  Nose: Nose normal.  Mouth/Throat: Uvula is midline, oropharynx is clear and moist and mucous membranes are normal.  Eyes: Conjunctivae, EOM and lids are normal. Pupils are equal, round, and reactive to light. Right eye exhibits no discharge. Left eye exhibits no discharge. No scleral icterus.  Neck: Trachea normal. Carotid bruit is not present. Thyromegaly (right sided) present.  Cardiovascular: Normal rate, regular rhythm, normal heart sounds, intact distal pulses and normal pulses.   No murmur heard. Pulmonary/Chest: Effort normal and breath sounds normal. She has no wheezes. She has no rhonchi. She has no rales. Right breast exhibits no inverted nipple, no mass, no nipple discharge, no skin change and no tenderness. Left breast exhibits mass (1 cm, 5 oclock, nontender). Left breast exhibits no inverted nipple, no nipple discharge, no skin change and  no tenderness. Breasts are symmetrical.  Abdominal: Soft. Normal appearance and bowel sounds are normal. She exhibits no abdominal bruit. There is no tenderness.  Musculoskeletal: Normal range of motion.  Lymphadenopathy:       Head (right side): No submental, no submandibular, no tonsillar, no preauricular, no posterior auricular and no occipital adenopathy present.       Head  (left side): No submental, no submandibular, no tonsillar, no preauricular, no posterior auricular and no occipital adenopathy present.    She has no cervical adenopathy.    She has no axillary adenopathy.       Right: No supraclavicular adenopathy present.       Left: No supraclavicular adenopathy present.  Neurological: She is alert and oriented to person, place, and time. No cranial nerve deficit. Coordination and gait normal.  Skin: Skin is warm, dry and intact. No lesion and no rash noted.  Psychiatric: She has a normal mood and affect. Her speech is normal and behavior is normal. Thought content normal.   BP 108/64 mmHg  Pulse 60  Temp(Src) 98.4 F (36.9 C) (Oral)  Resp 16  Ht  (1.702 m)  Wt 150 lb (68.04 kg)  BMI 23.49 kg/m2  SpO2 97%  LMP 05/05/2015  Assessment and Plan:  1. Annual physical exam - CBC - Comprehensive metabolic panel - US Soft Tissue Head/Neck; Future  2. Breast mass in female Left, 5 oclock position, likely benign but will get u/s. - US BREAST COMPLETE UNI LEFT INC AXILLA; Future  3. Thyromegaly Right sided. tsh pending. Referred for thyroid u/s - TSH  4. Cervical cancer screening - Pap IG and Chlamydia/Gonococcus, NAA   Joni Reining V. Dyke Brackett, MHS [facility]  05/29/2015

## 2015-06-01 LAB — PAP IG AND CT-NG NAA
Chlamydia Probe Amp: NOT DETECTED
GC Probe Amp: NOT DETECTED

## 2015-06-02 ENCOUNTER — Ambulatory Visit
Admission: RE | Admit: 2015-06-02 | Discharge: 2015-06-02 | Disposition: A | Discharge status: Home / Self Care | Payer: BC Managed Care – PPO | Source: Ambulatory Visit | Attending: Physician Assistant | Admitting: Physician Assistant

## 2015-06-02 DIAGNOSIS — N63 Unspecified lump in unspecified breast: Secondary | ICD-10-CM

## 2015-06-02 DIAGNOSIS — E010 Iodine-deficiency related diffuse (endemic) goiter: Secondary | ICD-10-CM

## 2015-11-10 ENCOUNTER — Encounter: Payer: Self-pay | Admitting: Physician Assistant

## 2015-11-10 ENCOUNTER — Ambulatory Visit (INDEPENDENT_AMBULATORY_CARE_PROVIDER_SITE_OTHER): Payer: BC Managed Care – PPO | Admitting: Physician Assistant

## 2015-11-10 VITALS — BP 110/70 | HR 60 | Temp 98.2°F | Resp 18 | Ht 67.0 in | Wt 151.0 lb

## 2015-11-10 DIAGNOSIS — Z711 Person with feared health complaint in whom no diagnosis is made: Secondary | ICD-10-CM

## 2015-11-10 DIAGNOSIS — Z23 Encounter for immunization: Secondary | ICD-10-CM | POA: Diagnosis not present

## 2015-11-10 NOTE — Progress Notes (Signed)
   Shelly CirriLindsay L Delacruz  MRN: [id number] DOB: 03-Aug-1990  PCP: No PCP Per Patient  Subjective:  Pt is a healthy [age] year old female presenting to the clinic today because her insurance is about to run out and she wants to make sure she is healthy. She has no complaints today. She [facility] 8 months ago, lab tests normal.   STD tests recently performed elsewhere were negative. No new sexual partner since that time. Women sex partners only.  She is in good health. Works out twice a day. Is a semi-pro basketball player.   She has never received her HPV vaccine. She is interested in receiving one today.   Review of Systems  Constitutional: Negative.   Respiratory: Negative.   Cardiovascular: Negative.   Gastrointestinal: Negative.   Genitourinary: Negative.   Neurological: Negative.     There are no active problems to display for this patient.   Current Outpatient Prescriptions on File Prior to Visit  Medication Sig Dispense Refill  . Albuterol Sulfate (PROAIR RESPICLICK) 108 (90 Base) MCG/ACT AEPB Inhale 2 puffs into the lungs every 6 (six) hours as needed. (Patient not taking: Reported on 05/29/2015) 1 each 3   No current facility-administered medications on file prior to visit.     No Known Allergies  Objective:  BP 110/70 (BP Location: Right Arm, Patient Position: Sitting, Cuff Size: Small)   Pulse 60   Temp 98.2 F (36.8 C) (Oral)   Resp 18   Ht 5\' 7"  (1.702 m)   Wt 151 lb (68.5 kg)   LMP 10/13/2015   SpO2 100%   BMI 23.65 kg/m   Physical Exam  Constitutional: She is oriented to person, place, and time. No distress.  HENT:  Head: Normocephalic and atraumatic.  Cardiovascular: Normal rate, regular rhythm and normal heart sounds.   Pulmonary/Chest: Effort normal and breath sounds normal. No respiratory distress. She has no wheezes. She has no rales.  Abdominal: Soft. There is no tenderness.  Neurological: She is alert and oriented to person, place, and time. GCS  score is 15.  Skin: Skin is warm and dry.  Psychiatric: Mood, memory, affect and judgment normal.  Vitals reviewed.   Assessment and Plan :  1. Need for HPV vaccination - HPV 9-valent vaccine,Recombinat - Patient's insurance may run out before she is able to get all three vaccination injections. This was discussed with patient. She understands and would like to receive as many as she can.   2. Worried well   Marco CollieWhitney Yanisa Goodgame, PA-C  [facility] 11/10/2015 11:56 AM

## 2015-11-10 NOTE — Patient Instructions (Addendum)
IF you received an x-ray today, you will receive an invoice from [facility]. Please contact [facility] at [phone] with questions or concerns regarding your invoice.   IF you received labwork today, you will receive an invoice from [employer]. Please contact [facility] at [phone] with questions or concerns regarding your invoice.   Our billing staff will not be able to assist you with questions regarding bills from these companies.  You will be contacted with the lab results as soon as they are available. The fastest way to get your results is to activate your My Chart account. Instructions are located on the last page of this paperwork. If you have not heard from [place] regarding the results in 2 weeks, please contact this office.    Keeping You Healthy  Get These Tests 1. Blood Pressure- Have your blood pressure checked once a year by your health care provider.  Normal blood pressure is 120/80. 2. Weight- Have your body mass index (BMI) calculated to screen for obesity.  BMI is measure of body fat based on height and weight.  You can also calculate your own BMI at GravelBags.it. 3. Cholesterol- Have your cholesterol checked every 5 years starting at age 78 then yearly starting at age 51. 96. Chlamydia, HIV, and other sexually transmitted diseases- Get screened every year until age 88, then within three months of each new sexual provider. 5. Pap Test - Every 1-5 years; discuss with your health care provider. 6. Mammogram- Every 1-2 years starting at age 47--50  Take these medicines  Calcium with Vitamin D-Your body needs 1200 mg of Calcium each day and [phone] IU of Vitamin D daily.  Your body can only absorb 500 mg of Calcium at a time so Calcium must be taken in 2 or 3 divided doses throughout the day.  Multivitamin with folic acid- Once daily if it is possible for you to become pregnant.  Get these  Immunizations  Gardasil-Series of three doses; prevents HPV related illness such as genital warts and cervical cancer.  Menactra-Single dose; prevents meningitis.  Tetanus shot- Every 10 years.  Flu shot-Every year.  Take these steps 1. Do not smoke-Your healthcare provider can help you quit.  For tips on how to quit go to www.smokefree.gov or call 1-800 QUITNOW. 2. Be physically active- Exercise 5 days a week for at least 30 minutes.  If you are not already physically active, start slow and gradually work up to 30 minutes of moderate physical activity.  Examples of moderate activity include walking briskly, dancing, swimming, bicycling, etc. 3. Breast Cancer- A self breast exam every month is important for early detection of breast cancer.  For more information and instruction on self breast exams, ask your healthcare provider or https://www.patel.info/. 4. Eat a healthy diet- Eat a variety of healthy foods such as fruits, vegetables, whole grains, low fat milk, low fat cheeses, yogurt, lean meats, poultry and fish, beans, nuts, tofu, etc.  For more information go to www. Thenutritionsource.org 5. Drink alcohol in moderation- Limit alcohol intake to one drink or less per day. Never drink and drive. 6. Depression- Your emotional health is as important as your physical health.  If you're feeling down or losing interest in things you normally enjoy please talk to your healthcare provider about being screened for depression. 7. Dental visit- Brush and floss your teeth twice daily; visit your dentist twice a year. 8. Eye doctor- Get an eye exam at least every 2  years. 9. Helmet use- Always wear a helmet when riding a bicycle, motorcycle, rollerblading or skateboarding. 10. Safe sex- If you may be exposed to sexually transmitted infections, use a condom. 11. Seat belts- Seat belts can save your live; always wear one. 12. Smoke/Carbon Monoxide detectors- These detectors need to  be installed on the appropriate level of your home. Replace batteries at least once a year. 13. Skin cancer- When out in the sun please cover up and use sunscreen 15 SPF or higher. 14. Violence- If anyone is threatening or hurting you, please tell your healthcare provider.        Human Papillomavirus Human papillomavirus (HPV) is the most common sexually transmitted infection (STI). It is easy to pass it from person to person (contagious). HPV can cause cervical cancer, anal cancer, and genital warts. The genital warts can be seen and felt. Also, there may be wartlike regions in the throat. HPV may not have any symptoms. It is possible to have HPV for a long time and not know it. You may pass HPV on to others without knowing it.   Genital Warts Genital warts are a common STD (sexually transmitted disease). They may appear as small bumps on the tissues of the genital area or anal area. Sometimes, they can become irritated and cause pain. Genital warts are easily passed to other people through sexual contact. Getting treatment is important because genital warts can lead to other problems. In females, the virus that causes genital warts may increase the risk of cervical cancer. CAUSES Genital warts are caused by a virus that is called human papillomavirus (HPV). HPV is spread by having unprotected sex with an infected person. It can be spread through vaginal, anal, and oral sex. Many people do not know that they are infected. They may be infected for years without problems. However, even if they do not have problems, they can pass the infection to their sexual partners. RISK FACTORS Genital warts are more likely to develop in:  People who have unprotected sex.  People who have multiple sexual partners.  People who become sexually active before they are 25 years of age.  Men who are not circumcised.  Women who have a female sexual partner who is not circumcised.  People who have a weakened  body defense system (immune system) due to disease or medicine.  People who smoke. SYMPTOMS Symptoms of genital warts include:  Small growths in the genital area or anal area. These warts often grow in clusters.  Itching and irritation in the genital area or anal area.  Bleeding from the warts.  Painful sexual intercourse. DIAGNOSIS Genital warts can usually be diagnosed from their appearance on the vagina, vulva, penis, perineum, anus, or rectum. Tests may also be done, such as:  Biopsy. A tissue sample is removed so it can be looked at under a microscope.  Colposcopy. In females, a magnifying tool is used to examine the vagina and cervix. Certain solutions may be used to make the HPV cells change color so they can be seen more easily.  A Pap test in females.  Tests for other STDs. TREATMENT Treatment for genital warts may include:  Applying prescription medicines to the warts. These may be solutions or creams.  Freezing the warts with liquid nitrogen (cryotherapy).  Burning the warts with:  Laser treatment.  An electrified probe (electrocautery).  Injecting a substance (Candida antigen or Trichophyton antigen) into the warts to help the body's immune system to fight off the warts.  Interferon injections.  Surgery to remove the warts.

## 2015-11-26 ENCOUNTER — Ambulatory Visit (INDEPENDENT_AMBULATORY_CARE_PROVIDER_SITE_OTHER): Payer: BC Managed Care – PPO | Admitting: Family Medicine

## 2015-11-26 VITALS — BP 110/76 | HR 53 | Temp 98.0°F | Resp 15 | Ht 67.0 in | Wt 150.4 lb

## 2015-11-26 DIAGNOSIS — H7291 Unspecified perforation of tympanic membrane, right ear: Secondary | ICD-10-CM | POA: Diagnosis not present

## 2015-11-26 MED ORDER — OFLOXACIN 0.3 % OT SOLN: 5.0000 [drp] | Freq: Every day | OTIC | 0 refills | Status: DC

## 2015-11-26 NOTE — Progress Notes (Signed)
   Patient ID: Shelly Delacruz, female    DOB: 1990/11/13, 25 y.o.   MRN: [id number]  PCP: No PCP Per Patient  Chief Complaint  Patient presents with  . Ear Pain    right ear fullness x 1.5 weeks , declines flu shot today     Subjective:   HPI Presents for evaluation of right ear pressure x 7 days.  25 year old, sunday with a complaint of right ear pressure 7 days. She denies any nasal drainage or sinus symptoms. She reports removing some cerumen from her ear with a Q-tip.  She denies hearing loss and reports no pain. He is experiencing just a sensation of fullness and ear pressure.  . Social History   Social History  . Marital status: Single    Spouse name: N/A  . Number of children: N/A  . Years of education: N/A   Occupational History  . Not on file.   Social History Main Topics  . Smoking status: Never Smoker  . Smokeless tobacco: Never Used  . Alcohol use 3.3 oz/week    3 Cans of beer, 3 Standard drinks or equivalent per week  . Drug use: No  . Sexual activity: Yes    Birth control/ protection: None     Comment: female partners   Other Topics Concern  . Not on file   Social History Narrative  . No narrative on file    . Family History  Problem Relation Age of Onset  . Hypertension Mother   . Hyperlipidemia Mother    Review of Systems  HENT: Negative for congestion, hearing loss, postnasal drip, sinus pressure, sneezing and sore throat.        See HPI   There are no active problems to display for this patient.    Prior to Admission medications   Not on File   No Known Allergies     Objective:  Physical Exam  Constitutional: She is oriented to person, place, and time. She appears well-developed and well-nourished.  HENT:  Head: Normocephalic.  Right TM perforated medially. Mild cerumen present medially at TM. Negative of exudate or erythema. Gross hearing intact.  Eyes: Conjunctivae and EOM are normal. Pupils are equal, round, and reactive to  light.  Neck: Normal range of motion.  Cardiovascular: Normal rate.   Pulmonary/Chest: Effort normal.  Musculoskeletal: Normal range of motion.  Neurological: She is alert and oriented to person, place, and time.   Vitals:   11/26/15 0841  BP: 110/76  Pulse: (!) 53  Resp: 15  Temp: 98 F (36.7 C)   Assessment & Plan:  1. Ear drum perforation, right, stable no evidence of infection. Plan:  Watchful waiting. Return in 4 weeks for recheck.f you begin to experience pain or purulent drainage, begin using prescribed ear drops.  Avoid ear buds or loud music in right ear to prevent hearing loss.  May take ibuprofen as needed for pain.  Godfrey PickKimberly S. Tiburcio PeaHarris, MSN, FNP-C [facility]

## 2015-11-26 NOTE — Patient Instructions (Addendum)
If you begin to experience pain or purulent drainage, begin using prescribed ear drops.  Avoid ear buds or loud music in right ear to prevent hearing loss.  May take ibuprofen as needed for pain.   IF you received an x-ray today, you will receive an invoice from [facility]. Please contact [facility] at [phone] with questions or concerns regarding your invoice.   IF you received labwork today, you will receive an invoice from [facility]. Please contact [facility] at [phone] with questions or concerns regarding your invoice.   Our billing staff will not be able to assist you with questions regarding bills from these companies.  You will be contacted with the lab results as soon as they are available. The fastest way to get your results is to activate your My Chart account. Instructions are located on the last page of this paperwork. If you have not heard from us regarding the results in 2 weeks, please contact this office.     Eardrum Perforation The eardrum is a thin, round tissue inside the ear. It allows you to hear. The eardrum can get torn (perforated). Eardrums often heal on their own. There is often little or no long-term hearing loss. HOME CARE  Keep your ear dry while it heals. Do not let your head go under water. Do not swim or dive until your doctor says it is okay.  Before you take a bath or shower, do one of these things to keep water out of your ear:  Put a waterproof earplug in your ear.  Put petroleum jelly all over a cotton ball. Put the cotton ball in your ear.  Take medicines only as told by your doctor.  Avoid blowing your nose if you can. If you blow your nose, do it gently.  Continue your normal activities after your eardrum heals. Your doctor will tell you when your eardrum has healed.  Talk to your doctor before you fly on an airplane.  Keep all doctor follow-up visits as told by your doctor. This is  important. GET HELP IF:  You have a fever. GET HELP RIGHT AWAY IF:  You have blood or yellowish-white fluid (pus) coming from your ear.  You feel dizzy or off balance.  You feel sick to your stomach (nauseous), or you throw up (vomit).  You have more pain.   This information is not intended to replace advice given to you by your health care provider. Make sure you discuss any questions you have with your health care provider.   Document Released: 09/01/2009 Document Revised: 04/04/2014 Document Reviewed: 10/21/2013 Elsevier Interactive Patient Education [employer]2016 Elsevier Inc.

## 2015-12-17 ENCOUNTER — Ambulatory Visit (INDEPENDENT_AMBULATORY_CARE_PROVIDER_SITE_OTHER): Payer: BC Managed Care – PPO | Admitting: Physician Assistant

## 2015-12-17 VITALS — BP 100/82 | HR 63 | Temp 97.6°F | Ht 67.0 in | Wt 152.0 lb

## 2015-12-17 DIAGNOSIS — J302 Other seasonal allergic rhinitis: Secondary | ICD-10-CM | POA: Diagnosis not present

## 2015-12-17 DIAGNOSIS — H6592 Unspecified nonsuppurative otitis media, left ear: Secondary | ICD-10-CM

## 2015-12-17 MED ORDER — FLUTICASONE PROPIONATE 50 MCG/ACT NA SUSP
2.0000 | Freq: Every day | NASAL | 12 refills | Status: DC
Start: 2015-12-17 — End: 2016-12-22

## 2015-12-17 MED ORDER — CETIRIZINE HCL 10 MG PO TABS
10.0000 mg | ORAL_TABLET | Freq: Every day | ORAL | 11 refills | Status: DC
Start: 2015-12-17 — End: 2016-12-22

## 2015-12-17 MED ORDER — GUAIFENESIN ER 1200 MG PO TB12: 1.0000 | ORAL_TABLET | Freq: Two times a day (BID) | ORAL | 1 refills | Status: DC | PRN

## 2015-12-17 NOTE — Patient Instructions (Signed)

## 2015-12-17 NOTE — Progress Notes (Signed)
[facility] [address], [place] [address] [phone]  By signing my name below, I, [name], attest that this documentation has been prepared under the direction and in the presence of Trena PlattStephanie English, [place]  Electronically Signed: Arvilla MarketMesha Guinyard, Medical Scribe. 12/17/15. 3:53 PM.  Date:  12/17/2015   Name:  Shelly Delacruz   DOB:  1990-06-14   MRN:  [id number]  PCP:  No PCP Per Patient   Chief Complaint  Patient presents with   Ear Pain    Left onset 1-2 weeks    History of Present Illness:  Shelly Delacruz is a 25 y.o. female patient who [facility] complaining of left ear pain onset 3 days ago. Pt states her initial symptoms of left ear pressure felt similar to the symptoms felt 8/31 when she came in last month. Pt often wears head phones. Pt has been using ofloxacin for the past 2 days with some relief to her symptoms. Pt drinks about 4 bottles of water a day. Pt has mild allergies and doesn't treat them. Pt used to treat her allergies as a kid, but states they went away when she was a teenager. Pt denies sore throat, fatigue, and fever.  There are no active problems to display for this patient.   Past Medical History:  Diagnosis Date   Abnormal EKG    Allergy    Chest pain     Past Surgical History:  Procedure Laterality Date   SHOULDER SURGERY      Social History  Substance Use Topics   Smoking status: Never Smoker   Smokeless tobacco: Never Used   Alcohol use 3.3 oz/week    3 Cans of beer, 3 Standard drinks or equivalent per week    Family History  Problem Relation Age of Onset   Hypertension Mother    Hyperlipidemia Mother     No Known Allergies  Medication list has been reviewed and updated.  Current Outpatient Prescriptions on File Prior to Visit  Medication Sig Dispense Refill   ofloxacin (FLOXIN OTIC) 0.3 % otic solution Place 5 drops into the right ear daily. 5 mL 0   No current  facility-administered medications on file prior to visit.     Review of Systems  Constitutional: Negative for fever and malaise/fatigue.  HENT: Positive for ear pain. Negative for sore throat.     Physical Examination: BP 100/82 (BP Location: Right Arm, Patient Position: Sitting, Cuff Size: Normal)    Pulse 63    Temp 97.6 F (36.4 C) (Oral)    Ht 5\' 7"  (1.702 m)    Wt 152 lb (68.9 kg)    LMP 11/18/2015    SpO2 100%    BMI 23.81 kg/m  Ideal Body Weight: @FLOWAMB ([id number])@([phone])@  Physical Exam  Constitutional: She is oriented to person, place, and time. She appears well-developed and well-nourished. No distress.  HENT:  Head: Normocephalic and atraumatic.  Right Ear: Tympanic membrane, external ear and ear canal normal.  Left Ear: Tympanic membrane is not erythematous and not bulging.  Nose: Mucosal edema and rhinorrhea present. Right sinus exhibits no maxillary sinus tenderness and no frontal sinus tenderness. Left sinus exhibits no maxillary sinus tenderness and no frontal sinus tenderness.  Mouth/Throat: No uvula swelling. No oropharyngeal exudate, posterior oropharyngeal edema or posterior oropharyngeal erythema.  Air fluid levels behind left ear drum however no buldging or erythema  Eyes: Conjunctivae and EOM are normal. Pupils are equal, round, and reactive to  light.  Cardiovascular: Normal rate and regular rhythm.  Exam reveals no gallop, no distant heart sounds and no friction rub.   No murmur heard. Pulmonary/Chest: Effort normal. No respiratory distress. She has no decreased breath sounds. She has no wheezes. She has no rhonchi.  Lymphadenopathy:       Head (right side): No submandibular, no tonsillar, no preauricular and no posterior auricular adenopathy present.       Head (left side): No submandibular, no tonsillar, no preauricular and no posterior auricular adenopathy present.  Neurological: She is alert and oriented to person, place, and time.  Skin: She is not diaphoretic.    Psychiatric: She has a normal mood and affect. Her behavior is normal.   Assessment and Plan: Shelly Delacruz is a 25 y.o. female who is here today for cc of left ear pain.  This does not appear infectious but secondary to untreated allergies and eustachian tube dysfunction. I'm issuing her a nasal spray at this time. Advised to use Zyrtec daily and to hydrate well. She will also use Mucinex for her symptoms. Patient will return to clinic as needed.  Mucoid otitis media of left ear with effusion - Plan: fluticasone (FLONASE) 50 MCG/ACT nasal spray, cetirizine (ZYRTEC) 10 MG tablet, Guaifenesin (MUCINEX MAXIMUM STRENGTH) 1200 MG TB12  Seasonal allergies - Plan: fluticasone (FLONASE) 50 MCG/ACT nasal spray, cetirizine (ZYRTEC) 10 MG tablet, Guaifenesin (MUCINEX MAXIMUM STRENGTH) 1200 MG TB12  Trena Platt, PA-C [facility] 12/17/2015 3:52 PM I personally performed the services described in this documentation, which was scribed in my presence. The recorded information has been reviewed and is accurate.

## 2016-04-25 ENCOUNTER — Ambulatory Visit (INDEPENDENT_AMBULATORY_CARE_PROVIDER_SITE_OTHER): Payer: BC Managed Care – PPO | Admitting: Family Medicine

## 2016-04-25 VITALS — BP 98/82 | HR 46 | Temp 97.5°F | Resp 16 | Ht 67.0 in | Wt 153.0 lb

## 2016-04-25 DIAGNOSIS — Z711 Person with feared health complaint in whom no diagnosis is made: Secondary | ICD-10-CM

## 2016-04-25 DIAGNOSIS — Z23 Encounter for immunization: Secondary | ICD-10-CM

## 2016-04-25 NOTE — Patient Instructions (Signed)
     IF you received an x-ray today, you will receive an invoice from [facility]. Please contact  Radiology at [phone] with questions or concerns regarding your invoice.   IF you received labwork today, you will receive an invoice from [facility]. Please contact [facility] at [phone] with questions or concerns regarding your invoice.   Our billing staff will not be able to assist you with questions regarding bills from these companies.  You will be contacted with the lab results as soon as they are available. The fastest way to get your results is to activate your My Chart account. Instructions are located on the last page of this paperwork. If you have not heard from us regarding the results in 2 weeks, please contact this office.     

## 2016-04-25 NOTE — Progress Notes (Signed)
  Chief Complaint  Patient presents with  . Other    Pt states they just dont feel good    HPI She saw spots on the inside of her cheek on the right lower jaw She reports that she had full std testing She states that she has never noticed this before and just looked at the inside of her gum     Past Medical History:  Diagnosis Date  . Abnormal EKG   . Allergy   . Chest pain     Current Outpatient Prescriptions  Medication Sig Dispense Refill  . cetirizine (ZYRTEC) 10 MG tablet Take 1 tablet (10 mg total) by mouth daily. 30 tablet 11  . fluticasone (FLONASE) 50 MCG/ACT nasal spray Place 2 sprays into both nostrils daily. 16 g 12  . Guaifenesin (MUCINEX MAXIMUM STRENGTH) 1200 MG TB12 Take 1 tablet (1,200 mg total) by mouth every 12 (twelve) hours as needed. 14 tablet 1  . ofloxacin (FLOXIN OTIC) 0.3 % otic solution Place 5 drops into the right ear daily. 5 mL 0   No current facility-administered medications for this visit.     Allergies: No Known Allergies  Past Surgical History:  Procedure Laterality Date  . SHOULDER SURGERY      Social History   Social History  . Marital status: Single    Spouse name: N/A  . Number of children: N/A  . Years of education: N/A   Social History Main Topics  . Smoking status: Never Smoker  . Smokeless tobacco: Never Used  . Alcohol use 3.3 oz/week    3 Cans of beer, 3 Standard drinks or equivalent per week  . Drug use: No  . Sexual activity: Yes    Birth control/ protection: None     Comment: female partners   Other Topics Concern  . None   Social History Narrative  . None    ROS  Objective: Vitals:   04/25/16 1545  BP: 98/82  Pulse: (!) 46  Resp: 16  Temp: 97.5 F (36.4 C)  TempSrc: Oral  SpO2: 100%  Weight: 153 lb (69.4 kg)  Height: 5\' 7"  (1.702 m)    Physical Exam  Constitutional: She appears well-developed and well-nourished. She is sleeping.  HENT:  Head: Normocephalic and atraumatic.  Mouth/Throat:  No oral lesions. No dental abscesses or uvula swelling.  Old scar along the molar line as thought pt has bitten inner cheek before.   Eyes: Conjunctivae and EOM are normal.    Assessment and Plan [name] was seen today for other.  Diagnoses and all orders for this visit:  Worried well- offered reassurance and advised pt to follow up with Dentist to avoid gum disease  Flu vaccine need -     Flu Vaccine QUAD 36+ mos IM     Kafi Dotter A Wren Pryce

## 2016-05-30 ENCOUNTER — Ambulatory Visit (INDEPENDENT_AMBULATORY_CARE_PROVIDER_SITE_OTHER): Payer: BC Managed Care – PPO | Admitting: Family Medicine

## 2016-05-30 VITALS — BP 110/66 | HR 96 | Temp 98.3°F | Resp 18 | Ht 67.0 in | Wt 150.0 lb

## 2016-05-30 DIAGNOSIS — Z13220 Encounter for screening for lipoid disorders: Secondary | ICD-10-CM | POA: Diagnosis not present

## 2016-05-30 DIAGNOSIS — L309 Dermatitis, unspecified: Secondary | ICD-10-CM | POA: Diagnosis not present

## 2016-05-30 DIAGNOSIS — Z130 Encounter for screening for diseases of the blood and blood-forming organs and certain disorders involving the immune mechanism: Secondary | ICD-10-CM

## 2016-05-30 DIAGNOSIS — Z131 Encounter for screening for diabetes mellitus: Secondary | ICD-10-CM

## 2016-05-30 DIAGNOSIS — Z0000 Encounter for general adult medical examination without abnormal findings: Secondary | ICD-10-CM

## 2016-05-30 DIAGNOSIS — Z1329 Encounter for screening for other suspected endocrine disorder: Secondary | ICD-10-CM | POA: Diagnosis not present

## 2016-05-30 DIAGNOSIS — G4700 Insomnia, unspecified: Secondary | ICD-10-CM | POA: Diagnosis not present

## 2016-05-30 MED ORDER — TRIAMCINOLONE ACETONIDE 0.1 % EX CREA: 1.0000 "application " | TOPICAL_CREAM | Freq: Two times a day (BID) | CUTANEOUS | 2 refills | Status: DC

## 2016-05-30 NOTE — Progress Notes (Signed)
Shelly Delacruz  MRN: [id number] DOB: [birth year]  Subjective:  Shelly Delacruz is a 26 y.o. female who presents for annual physical exam. Shelly Delacruz's last exam was March 2017 and the following were noted during that exam: Right thyroid nodule noted in which the ultrasound was performed and resulted in normal findings.  During that exam left breast mass was noted, ultrasound performed and 1 cm lump in the lower outer periareolar left breast which was consistent with fibroglandular tissue.  Last dental exam: January 2018 and gum tenderness from recent over indulgence in sweets. Last vision exam: Never. Denies visual impairments  Last pap smear: 2017 within normal findings Vaccinations:-     Current She continued to play semi-pro basketball.  Current Outpatient Prescriptions on File Prior to Visit  Medication Sig Dispense Refill  . cetirizine (ZYRTEC) 10 MG tablet Take 1 tablet (10 mg total) by mouth daily. (Patient not taking: Reported on 05/30/2016) 30 tablet 11  . fluticasone (FLONASE) 50 MCG/ACT nasal spray Place 2 sprays into both nostrils daily. (Patient not taking: Reported on 05/30/2016) 16 g 12  . Guaifenesin (MUCINEX MAXIMUM STRENGTH) 1200 MG TB12 Take 1 tablet (1,200 mg total) by mouth every 12 (twelve) hours as needed. (Patient not taking: Reported on 05/30/2016) 14 tablet 1   No current facility-administered medications on file prior to visit.     No Known Allergies  Social History   Social History  . Marital status: Single    Spouse name: N/A  . Number of children: N/A  . Years of education: N/A   Social History Main Topics  . Smoking status: Never Smoker  . Smokeless tobacco: Never Used  . Alcohol use 3.3 oz/week    3 Cans of beer, 3 Standard drinks or equivalent per week  . Drug use: No  . Sexual activity: Yes    Birth control/ protection: None     Comment: female partners   Other Topics Concern  . None   Social History Narrative  . None    Past Surgical History:    Procedure Laterality Date  . SHOULDER SURGERY      Family History  Problem Relation Age of Onset  . Hypertension Mother   . Hyperlipidemia Mother     Review of Systems  Eyes: Negative.   Respiratory:       Hx of shortness of breath with exercise Resolved   Cardiovascular: Negative.   Gastrointestinal: Negative for abdominal pain and nausea.  Genitourinary: Negative.        Irregular period patterns although school  Musculoskeletal:       Hx of rotator cuff surgery of right shoulder   Neurological: Positive for headaches.       Relieve with OTC medication   Hematological: Negative.   Psychiatric/Behavioral: Positive for sleep disturbance.       Disrupted sleep patterns Likes to sit up late and watched TV Eat late at night Started reading prior to bed    Objective:  BP 110/66 (BP Location: Right Arm, Patient Position: Sitting, Cuff Size: Small)   Pulse 96   Temp 98.3 F (36.8 C) (Oral)   Resp 18   Ht 5' 7"  (1.702 m)   Wt 150 lb (68 kg)   LMP 05/29/2016   SpO2 100%   BMI 23.49 kg/m   Physical Exam  Constitutional: She is oriented to person, place, and time and well-developed, well-nourished, and in no distress.  HENT:  Head: Normocephalic and atraumatic.  Right Ear: External ear normal.  Left Ear: External ear normal.  Nose: Nose normal.  Mouth/Throat: Oropharynx is clear and moist.  Eyes: Conjunctivae and EOM are normal. Pupils are equal, round, and reactive to light.  Neck: Normal range of motion. Neck supple. No thyromegaly present.  Cardiovascular: Normal rate, regular rhythm, normal heart sounds and intact distal pulses.   No murmur heard. Pulmonary/Chest: Effort normal and breath sounds normal.  Abdominal: Soft. Bowel sounds are normal.  Genitourinary:  Genitourinary Comments: Breasts: breasts appear normal, no suspicious masses, no skin or nipple changes or axillary nodes.  Musculoskeletal: Normal range of motion.  Lymphadenopathy:    She has no  cervical adenopathy.  Neurological: She is alert and oriented to person, place, and time. Gait normal. GCS score is 15.  Skin: Skin is warm and dry.  Dried macular patches on neck, upper back, and flexural surfaces of bilateral arms  Psychiatric: Mood, memory, affect and judgment normal.    Visual Acuity Screening   Right eye Left eye Both eyes  Without correction: 20/15 20/15 20/15   With correction:       Assessment and Plan :  Discussed healthy lifestyle, diet, exercise, preventative care, vaccinations, and addressed patient's concerns. Plan for follow up as needed.  Otherwise, plan for specific conditions below.  1. Annual physical exam - HIV antibody (with reflex) Age-appropriate anticipatory guidance provided.  2. Screening for Thyroid Disorder - Thyroid Panel With TSH  3. Screening, lipid - Lipid panel  4. Screening for deficiency anemia - CBC with Differential/Platelet  5. Screening for diabetes mellitus - CMP14+EGFR  6. Insomnia, unspecified type -Recommend Melatonin for acute insomnia   7. Eczema, unspecified type -Triamcinolone ointment applied to affected areas twice daily. -Purchase Eucerin cream and apply to skin as needed to prevent drying of skin.  Shelly Delacruz. Kenton Kingfisher, MSN, FNP-C [facility]

## 2016-05-30 NOTE — Patient Instructions (Addendum)
For insomnia  Try Melatonin. This may be purchased at any retailer that sales vitamins or supplements.  Eczema  Apply Triamcinolone ointment to affected areas and use Eucerin Cream to prevent drying of skin.   IF you received an x-ray today, you will receive an invoice from [facility]. Please contact [facility] at [phone] with questions or concerns regarding your invoice.   IF you received labwork today, you will receive an invoice from [facility]. Please contact [facility] at [phone] with questions or concerns regarding your invoice.   Our billing staff will not be able to assist you with questions regarding bills from these companies.  You will be contacted with the lab results as soon as they are available. The fastest way to get your results is to activate your My Chart account. Instructions are located on the last page of this paperwork. If you have not heard from us regarding the results in 2 weeks, please contact this office.      Insomnia Insomnia is a sleep disorder that makes it difficult to fall asleep or to stay asleep. Insomnia can cause tiredness (fatigue), low energy, difficulty concentrating, mood swings, and poor performance at work or school. There are three different ways to classify insomnia:  Difficulty falling asleep.  Difficulty staying asleep.  Waking up too early in the morning. Any type of insomnia can be long-term (chronic) or short-term (acute). Both are common. Short-term insomnia usually lasts for three months or less. Chronic insomnia occurs at least three times a week for longer than three months. What are the causes? Insomnia may be caused by another condition, situation, or substance, such as:  Anxiety.  Certain medicines.  Gastroesophageal reflux disease (GERD) or other gastrointestinal conditions.  Asthma or other breathing conditions.  Restless legs syndrome, sleep apnea, or other sleep disorders.  Chronic  pain.  Menopause. This may include hot flashes.  Stroke.  Abuse of alcohol, tobacco, or illegal drugs.  Depression.  Caffeine.  Neurological disorders, such as Alzheimer disease.  An overactive thyroid (hyperthyroidism). The cause of insomnia may not be known. What increases the risk? Risk factors for insomnia include:  Gender. Women are more commonly affected than men.  Age. Insomnia is more common as you get older.  Stress. This may involve your professional or personal life.  Income. Insomnia is more common in people with lower income.  Lack of exercise.  Irregular work schedule or night shifts.  Traveling between different time zones. What are the signs or symptoms? If you have insomnia, trouble falling asleep or trouble staying asleep is the main symptom. This may lead to other symptoms, such as:  Feeling fatigued.  Feeling nervous about going to sleep.  Not feeling rested in the morning.  Having trouble concentrating.  Feeling irritable, anxious, or depressed. How is this treated? Treatment for insomnia depends on the cause. If your insomnia is caused by an underlying condition, treatment will focus on addressing the condition. Treatment may also include:  Medicines to help you sleep.  Counseling or therapy.  Lifestyle adjustments. Follow these instructions at home:  Take medicines only as directed by your health care provider.  Keep regular sleeping and waking hours. Avoid naps.  Keep a sleep diary to help you and your health care provider figure out what could be causing your insomnia. Include:  When you sleep.  When you wake up during the night.  How well you sleep.  How rested you feel the next day.  Any side effects of  medicines you are taking.  What you eat and drink.  Make your bedroom a comfortable place where it is easy to fall asleep:  Put up shades or special blackout curtains to block light from outside.  Use a white noise  machine to block noise.  Keep the temperature cool.  Exercise regularly as directed by your health care provider. Avoid exercising right before bedtime.  Use relaxation techniques to manage stress. Ask your health care provider to suggest some techniques that may work well for you. These may include:  Breathing exercises.  Routines to release muscle tension.  Visualizing peaceful scenes.  Cut back on alcohol, caffeinated beverages, and cigarettes, especially close to bedtime. These can disrupt your sleep.  Do not overeat or eat spicy foods right before bedtime. This can lead to digestive discomfort that can make it hard for you to sleep.  Limit screen use before bedtime. This includes:  Watching TV.  Using your smartphone, tablet, and computer.  Stick to a routine. This can help you fall asleep faster. Try to do a quiet activity, brush your teeth, and go to bed at the same time each night.  Get out of bed if you are still awake after 15 minutes of trying to sleep. Keep the lights down, but try reading or doing a quiet activity. When you feel sleepy, go back to bed.  Make sure that you drive carefully. Avoid driving if you feel very sleepy.  Keep all follow-up appointments as directed by your health care provider. This is important. Contact a health care provider if:  You are tired throughout the day or have trouble in your daily routine due to sleepiness.  You continue to have sleep problems or your sleep problems get worse. Get help right away if:  You have serious thoughts about hurting yourself or someone else. This information is not intended to replace advice given to you by your health care provider. Make sure you discuss any questions you have with your health care provider. Document Released: 03/11/2000 Document Revised: 08/14/2015 Document Reviewed: 12/13/2013 Elsevier Interactive Patient Education  2017 [employer]. Eczema Eczema, also called atopic dermatitis,  is a skin disorder that causes inflammation of the skin. It causes a red rash and dry, scaly skin. The skin becomes very itchy. Eczema is generally worse during the cooler winter months and often improves with the warmth of summer. Eczema usually starts showing signs in infancy. Some children outgrow eczema, but it may last through adulthood. What are the causes? The exact cause of eczema is not known, but it appears to run in families. People with eczema often have a family history of eczema, allergies, asthma, or hay fever. Eczema is not contagious. Flare-ups of the condition may be caused by:  Contact with something you are sensitive or allergic to.  Stress. What are the signs or symptoms?  Dry, scaly skin.  Red, itchy rash.  Itchiness. This may occur before the skin rash and may be very intense. How is this diagnosed? The diagnosis of eczema is usually made based on symptoms and medical history. How is this treated? Eczema cannot be cured, but symptoms usually can be controlled with treatment and other strategies. A treatment plan might include:  Controlling the itching and scratching.  Use over-the-counter antihistamines as directed for itching. This is especially useful at night when the itching tends to be worse.  Use over-the-counter steroid creams as directed for itching.  Avoid scratching. Scratching makes the rash and itching worse.  It may also result in a skin infection (impetigo) due to a break in the skin caused by scratching.  Keeping the skin well moisturized with creams every day. This will seal in moisture and help prevent dryness. Lotions that contain alcohol and water should be avoided because they can dry the skin.  Limiting exposure to things that you are sensitive or allergic to (allergens).  Recognizing situations that cause stress.  Developing a plan to manage stress. Follow these instructions at home:  Only take over-the-counter or prescription medicines  as directed by your health care provider.  Do not use anything on the skin without checking with your health care provider.  Keep baths or showers short (5 minutes) in warm (not hot) water. Use mild cleansers for bathing. These should be unscented. You may add nonperfumed bath oil to the bath water. It is best to avoid soap and bubble bath.  Immediately after a bath or shower, when the skin is still damp, apply a moisturizing ointment to the entire body. This ointment should be a petroleum ointment. This will seal in moisture and help prevent dryness. The thicker the ointment, the better. These should be unscented.  Keep fingernails cut short. Children with eczema may need to wear soft gloves or mittens at night after applying an ointment.  Dress in clothes made of cotton or cotton blends. Dress lightly, because heat increases itching.  A child with eczema should stay away from anyone with fever blisters or cold sores. The virus that causes fever blisters (herpes simplex) can cause a serious skin infection in children with eczema. Contact a health care provider if:  Your itching interferes with sleep.  Your rash gets worse or is not better within 1 week after starting treatment.  You see pus or soft yellow scabs in the rash area.  You have a fever.  You have a rash flare-up after contact with someone who has fever blisters. This information is not intended to replace advice given to you by your health care provider. Make sure you discuss any questions you have with your health care provider. Document Released: 03/11/2000 Document Revised: 08/20/2015 Document Reviewed: 10/15/2012 Elsevier Interactive Patient Education  2017 [employer].

## 2016-05-31 ENCOUNTER — Encounter: Payer: Self-pay | Admitting: Family Medicine

## 2016-05-31 LAB — CBC WITH DIFFERENTIAL/PLATELET
BASOS ABS: 0 10*3/uL (ref 0.0–0.2)
BASOS: 0 %
EOS (ABSOLUTE): 0.1 10*3/uL (ref 0.0–0.4)
Eos: 3 %
Hematocrit: 39.8 % (ref 34.0–46.6)
Hemoglobin: 13.3 g/dL (ref 11.1–15.9)
IMMATURE GRANS (ABS): 0 10*3/uL (ref 0.0–0.1)
Immature Granulocytes: 0 %
Lymphocytes Absolute: 0.9 10*3/uL (ref 0.7–3.1)
Lymphs: 24 %
MCH: 31.7 pg (ref 26.6–33.0)
MCHC: 33.4 g/dL (ref 31.5–35.7)
MCV: 95 fL (ref 79–97)
Monocytes Absolute: 0.2 10*3/uL (ref 0.1–0.9)
Monocytes: 6 %
NEUTROS ABS: 2.5 10*3/uL (ref 1.4–7.0)
Neutrophils: 67 %
PLATELETS: 240 10*3/uL (ref 150–379)
RBC: 4.19 x10E6/uL (ref 3.77–5.28)
RDW: 13.2 % (ref 12.3–15.4)
WBC: 3.8 10*3/uL (ref 3.4–10.8)

## 2016-05-31 LAB — CMP14+EGFR
A/G RATIO: 1.6 (ref 1.2–2.2)
ALBUMIN: 4.6 g/dL (ref 3.5–5.5)
ALK PHOS: 52 IU/L (ref 39–117)
ALT: 15 IU/L (ref 0–32)
AST: 18 IU/L (ref 0–40)
BUN / CREAT RATIO: 13 (ref 9–23)
BUN: 12 mg/dL (ref 6–20)
Bilirubin Total: 0.4 mg/dL (ref 0.0–1.2)
CO2: 26 mmol/L (ref 18–29)
Calcium: 10 mg/dL (ref 8.7–10.2)
Chloride: 104 mmol/L (ref 96–106)
Creatinine, Ser: 0.94 mg/dL (ref 0.57–1.00)
GFR calc non Af Amer: 85 mL/min/{1.73_m2} (ref >59)
GFR, EST AFRICAN AMERICAN: 98 mL/min/{1.73_m2} (ref >59)
GLUCOSE: 88 mg/dL (ref 65–99)
Globulin, Total: 2.8 g/dL (ref 1.5–4.5)
Potassium: 4.7 mmol/L (ref 3.5–5.2)
Sodium: 142 mmol/L (ref 134–144)
TOTAL PROTEIN: 7.4 g/dL (ref 6.0–8.5)

## 2016-05-31 LAB — THYROID PANEL WITH TSH
Free Thyroxine Index: 1.9 (ref 1.2–4.9)
T3 Uptake Ratio: 29 % (ref 24–39)
T4 TOTAL: 6.4 ug/dL (ref 4.5–12.0)
TSH: 0.963 u[IU]/mL (ref 0.450–4.500)

## 2016-05-31 LAB — LIPID PANEL
CHOL/HDL RATIO: 1.7 ratio (ref 0.0–4.4)
CHOLESTEROL TOTAL: 148 mg/dL (ref 100–199)
HDL: 88 mg/dL (ref >39)
LDL CALC: 53 mg/dL (ref 0–99)
TRIGLYCERIDES: 35 mg/dL (ref 0–149)
VLDL CHOLESTEROL CAL: 7 mg/dL (ref 5–40)

## 2016-05-31 LAB — HIV ANTIBODY (ROUTINE TESTING W REFLEX): HIV Screen 4th Generation wRfx: NONREACTIVE

## 2016-06-16 IMAGING — US US SOFT TISSUE HEAD/NECK
1 series · 14 of 25 positions shown · non-contrast
Comparison: None.

CLINICAL DATA: Thyromegaly on physical exam

EXAM:
THYROID ULTRASOUND
TECHNIQUE: Ultrasound examination of the thyroid gland and adjacent soft
tissues was performed.

[Series 1: us soft tissue head/neck · 0.07mm/px · 14 of 34 slices shown]
[im 1/34]
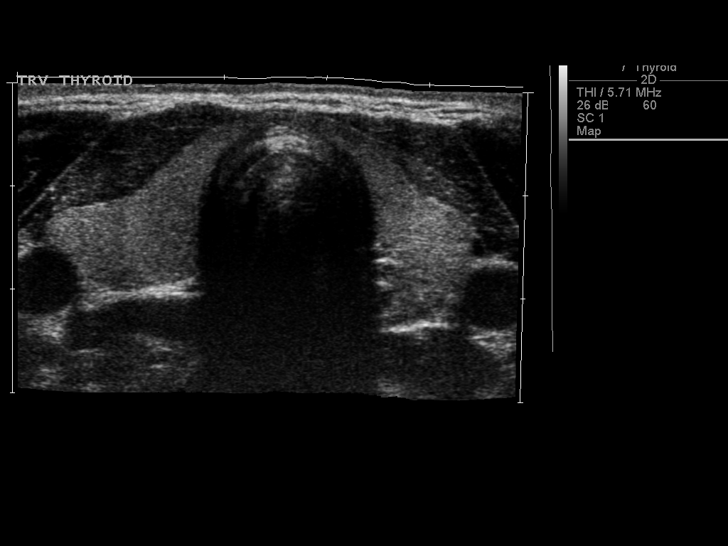
[im 3/34]
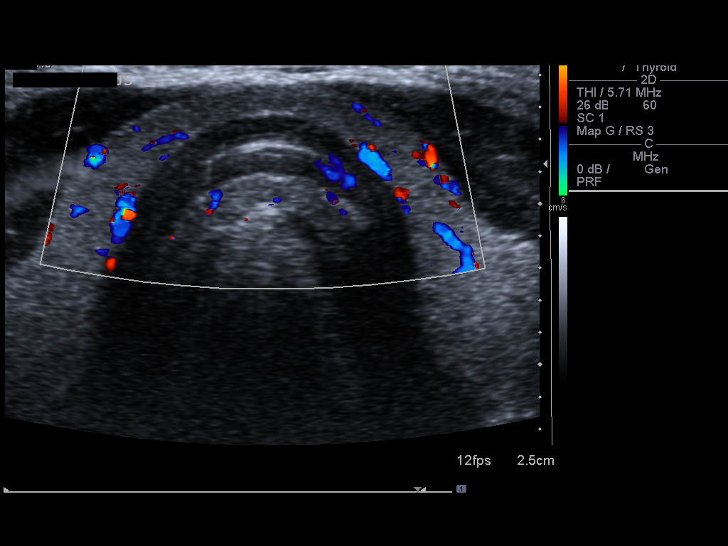
[im 6/34]
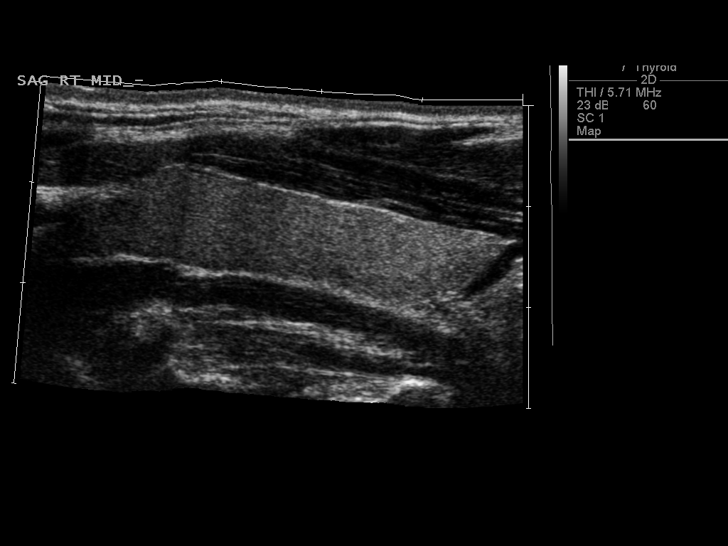
[im 9/34]
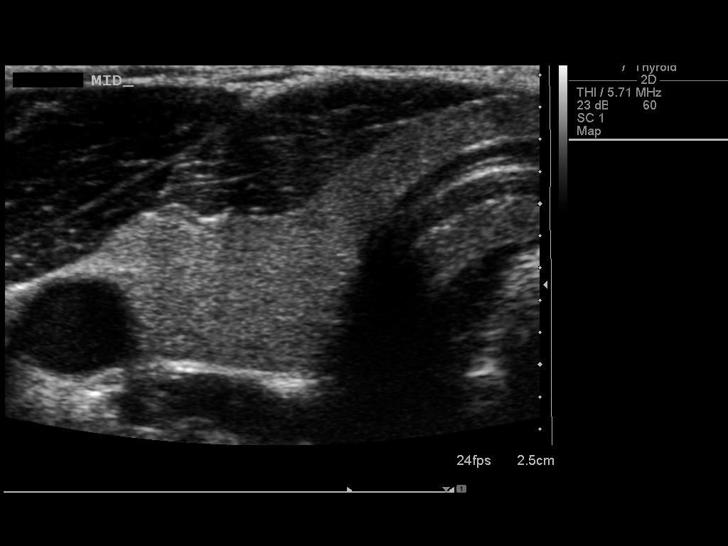
[im 12/34]
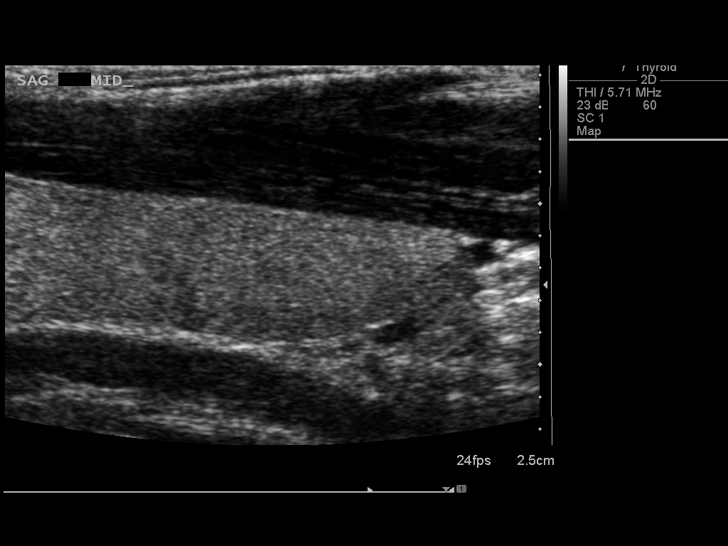
[im 13/34]
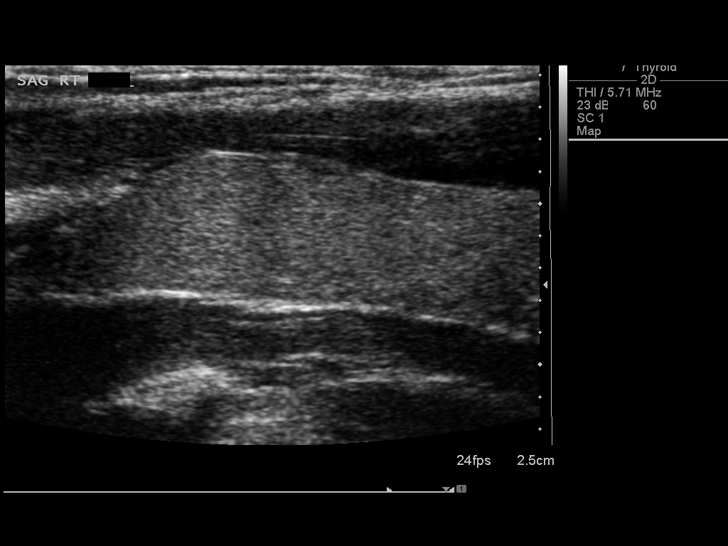
[im 16/34]
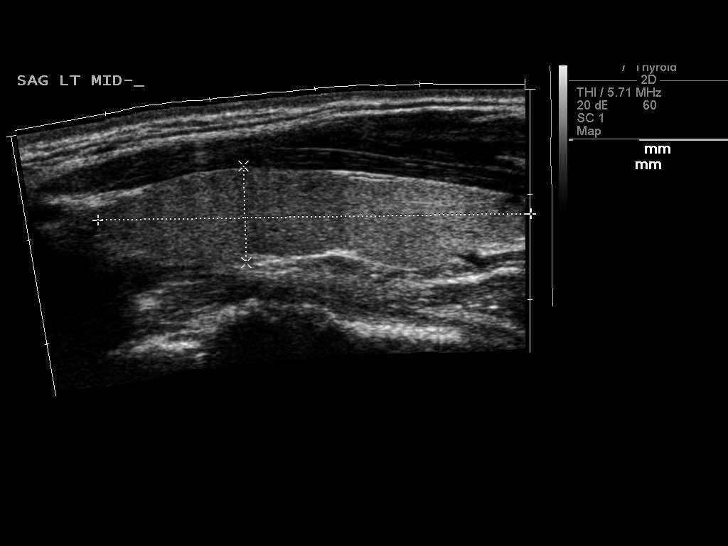
[im 18/34]
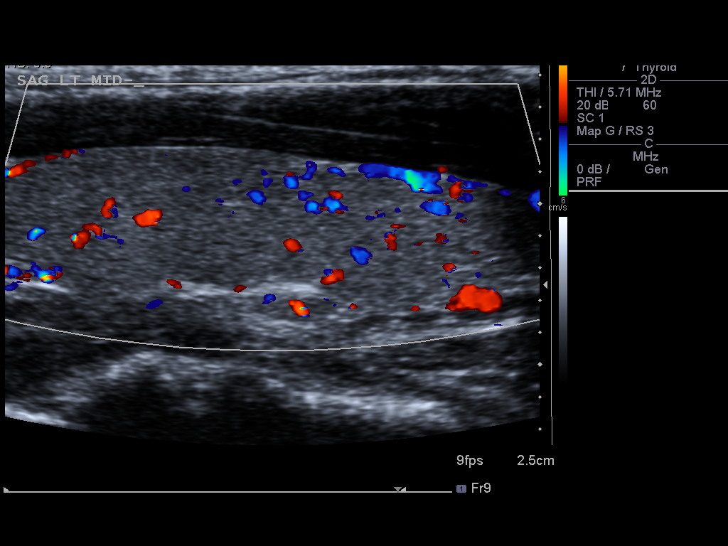
[im 21/34]
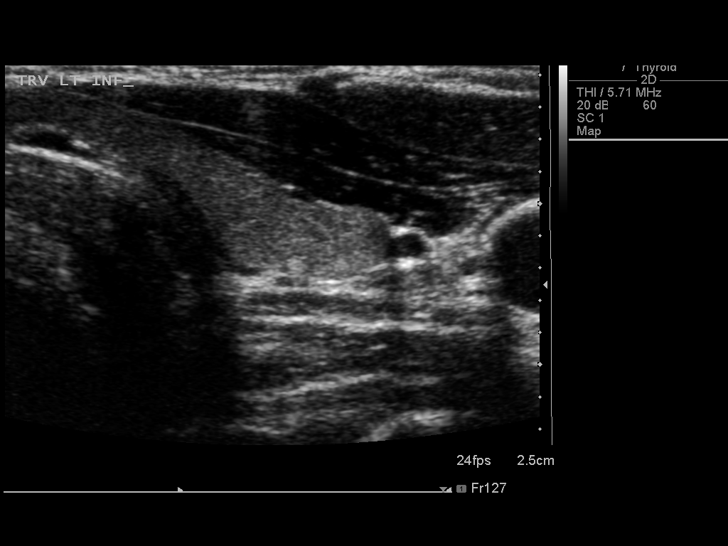
[im 23/34]
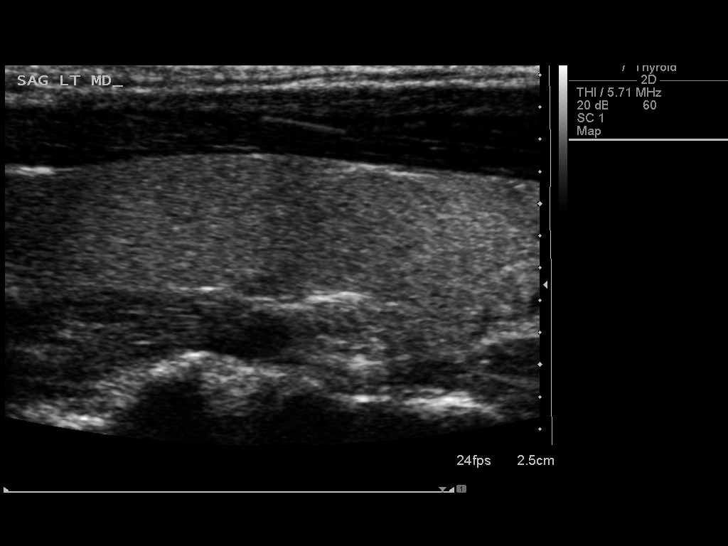
[im 25/34]
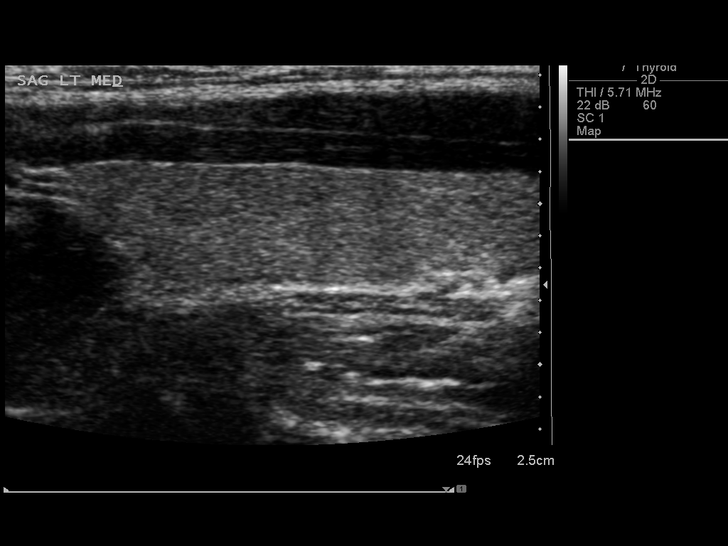
[im 28/34]
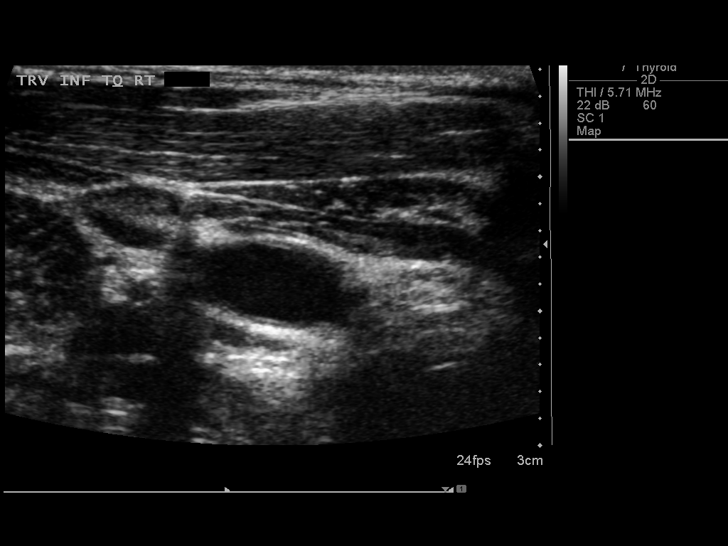
[im 31/34]
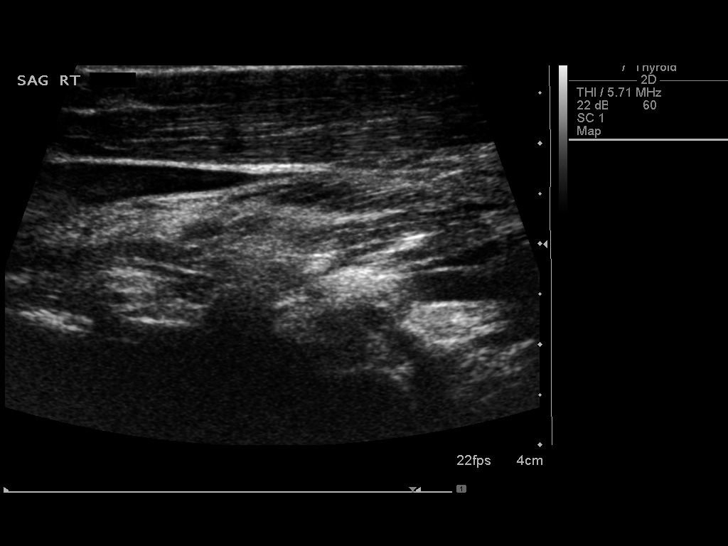
[im 34/34]
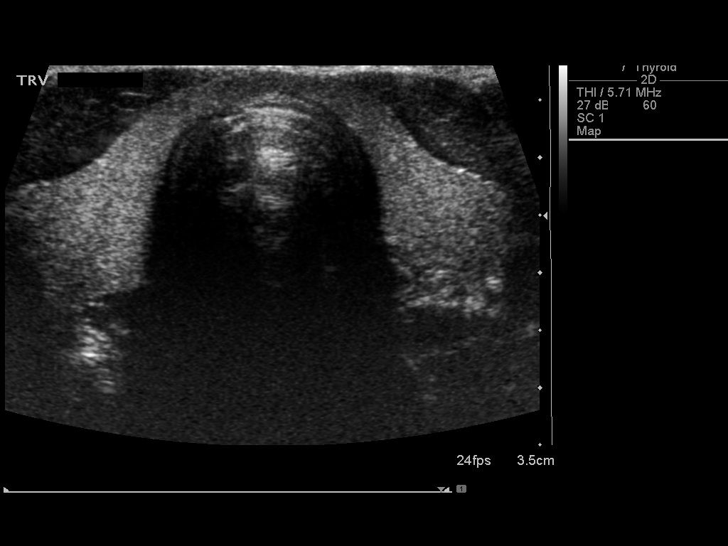

[14 of 25 positions shown; findings below may reference images not displayed]

FINDINGS: Right thyroid lobe

Measurements: 4.1 x 1 x 1.5 cm. Homogeneous background echotexture
without focal lesion.

Left thyroid lobe

Measurements: 4.1 x 0.9 x 1.6 cm.  No nodules visualized.

Isthmus

Thickness: 0.2 cm.  No nodules visualized.

Lymphadenopathy

None visualized.
IMPRESSION: Normal.

## 2016-06-21 ENCOUNTER — Ambulatory Visit (INDEPENDENT_AMBULATORY_CARE_PROVIDER_SITE_OTHER): Payer: BC Managed Care – PPO | Admitting: Physician Assistant

## 2016-06-21 VITALS — BP 114/73 | HR 59 | Temp 99.3°F | Resp 17 | Ht 67.5 in | Wt 152.0 lb

## 2016-06-21 DIAGNOSIS — J22 Unspecified acute lower respiratory infection: Secondary | ICD-10-CM

## 2016-06-21 DIAGNOSIS — J302 Other seasonal allergic rhinitis: Secondary | ICD-10-CM

## 2016-06-21 MED ORDER — GUAIFENESIN ER 1200 MG PO TB12: 1.0000 | ORAL_TABLET | Freq: Two times a day (BID) | ORAL | 1 refills | Status: DC | PRN

## 2016-06-21 MED ORDER — AZITHROMYCIN 250 MG PO TABS: ORAL_TABLET | ORAL | 0 refills | Status: DC

## 2016-06-21 MED ORDER — BENZONATATE 100 MG PO CAPS: 100.0000 mg | ORAL_CAPSULE | Freq: Three times a day (TID) | ORAL | 0 refills | Status: DC | PRN

## 2016-06-21 NOTE — Progress Notes (Signed)
[facility] [address], [place] [id number] [phone]  Date:  06/21/2016   Name:  Shelly Delacruz   DOB:  [birth year]   MRN:  [id number]  PCP:  No PCP Per Patient    History of Present Illness:  Shelly Delacruz is a 26 y.o. female patient who [facility] with  Chief Complaint  Patient presents with  . Cough  . URI     2 weeks ago, she woke up with congestion.  2 days later, she developed malaise, more congestion, hoarseness, and coughing.  Day 3-- hoarseness, and progressively worsened coughing.  She noticed some improvement, for a few days, but then proceeded to have the continuous coughing, though she does note she is feeling better.  She is eating soups, and taking alka-seltzer and theraflu, and cough drops which had minimal improvement.  She is sneezing.  No watery eyes.  Productive cough thick.  No sob or dyspnea.  Very mild fatigue noticed but able to perform her daily activities.  No fevers.     There are no active problems to display for this patient.   Past Medical History:  Diagnosis Date  . Abnormal EKG   . Allergy   . Chest pain     Past Surgical History:  Procedure Laterality Date  . SHOULDER SURGERY      Social History  Substance Use Topics  . Smoking status: Never Smoker  . Smokeless tobacco: Never Used  . Alcohol use 3.3 oz/week    3 Cans of beer, 3 Standard drinks or equivalent per week    Family History  Problem Relation Age of Onset  . Hypertension Mother   . Hyperlipidemia Mother     No Known Allergies  Medication list has been reviewed and updated.  Current Outpatient Prescriptions on File Prior to Visit  Medication Sig Dispense Refill  . cetirizine (ZYRTEC) 10 MG tablet Take 1 tablet (10 mg total) by mouth daily. 30 tablet 11  . fluticasone (FLONASE) 50 MCG/ACT nasal spray Place 2 sprays into both nostrils daily. 16 g 12  . Guaifenesin (MUCINEX MAXIMUM STRENGTH) 1200 MG TB12 Take 1 tablet (1,200 mg  total) by mouth every 12 (twelve) hours as needed. 14 tablet 1  . triamcinolone cream (KENALOG) 0.1 % Apply 1 application topically 2 (two) times daily. 80 g 2   No current facility-administered medications on file prior to visit.     ROS ROS otherwise unremarkable unless listed above.  Physical Examination: BP 114/73   Pulse (!) 59   Temp 99.3 F (37.4 C) (Oral)   Resp 17   Ht 5' 7.5" (1.715 m)   Wt 152 lb (68.9 kg)   LMP 05/29/2016 (Approximate)   SpO2 97%   BMI 23.46 kg/m  Ideal Body Weight: Weight in (lb) to have BMI = 25: 161.7  Physical Exam  Constitutional: She is oriented to person, place, and time. She appears well-developed and well-nourished. No distress.  HENT:  Head: Normocephalic and atraumatic.  Right Ear: Tympanic membrane, external ear and ear canal normal.  Left Ear: Tympanic membrane, external ear and ear canal normal.  Nose: Mucosal edema present. No rhinorrhea. Right sinus exhibits no maxillary sinus tenderness and no frontal sinus tenderness. Left sinus exhibits no maxillary sinus tenderness and no frontal sinus tenderness.  Mouth/Throat: No uvula swelling. Posterior oropharyngeal edema present. No oropharyngeal exudate or posterior oropharyngeal erythema.  Eyes: Conjunctivae and EOM are normal. Pupils are equal, round,  and reactive to light.  Cardiovascular: Normal rate and regular rhythm.  Exam reveals no gallop, no distant heart sounds and no friction rub.   No murmur heard. Pulmonary/Chest: Effort normal. No accessory muscle usage. No respiratory distress. She has no decreased breath sounds. She has no wheezes. She has no rhonchi.  Lymphadenopathy:       Head (right side): No submandibular, no tonsillar, no preauricular and no posterior auricular adenopathy present.       Head (left side): No submandibular, no tonsillar, no preauricular and no posterior auricular adenopathy present.  Neurological: She is alert and oriented to person, place, and time.   Skin: She is not diaphoretic.  Psychiatric: She has a normal mood and affect. Her behavior is normal.     Assessment and Plan: Shelly Delacruz is a 26 y.o. female who is here today for cc of UR sxs. This is likely a resolving illness.  Advised to continue her zyrtec and flonase.  We will also add tessalon perls and mucinex at this time.  If she does not have improvement of her symptoms in the next 48-72 hours, she will start the abx.  Possible bronchitis of viral etiology.   Lower respiratory infection (e.g., bronchitis, pneumonia, pneumonitis, pulmonitis) - Plan: benzonatate (TESSALON) 100 MG capsule, Guaifenesin (MUCINEX MAXIMUM STRENGTH) 1200 MG TB12, azithromycin (ZITHROMAX) 250 MG tablet  Mucoid otitis media of left ear with effusion - Plan: Guaifenesin (MUCINEX MAXIMUM STRENGTH) 1200 MG TB12  Seasonal allergic rhinitis, unspecified chronicity, unspecified trigger - Plan: Guaifenesin (MUCINEX MAXIMUM STRENGTH) 1200 MG TB12  Shelly Platt, PA-C [facility] 3/27/201812:30 PM

## 2016-06-21 NOTE — Patient Instructions (Addendum)
Make sure that you hydrate well with 64 oz of water Take the medication to completion.   Continue your allergy medications.     IF you received an x-ray today, you will receive an invoice from [facility]. Please contact [facility] at [phone] with questions or concerns regarding your invoice.   IF you received labwork today, you will receive an invoice from [facility]. Please contact [facility] at [phone] with questions or concerns regarding your invoice.   Our billing staff will not be able to assist you with questions regarding bills from these companies.  You will be contacted with the lab results as soon as they are available. The fastest way to get your results is to activate your My Chart account. Instructions are located on the last page of this paperwork. If you have not heard from us regarding the results in 2 weeks, please contact this office.

## 2016-10-03 ENCOUNTER — Ambulatory Visit: Payer: BC Managed Care – PPO | Admitting: Physician Assistant

## 2016-12-08 ENCOUNTER — Encounter: Payer: Self-pay | Admitting: Family Medicine

## 2016-12-08 ENCOUNTER — Ambulatory Visit (INDEPENDENT_AMBULATORY_CARE_PROVIDER_SITE_OTHER): Payer: BLUE CROSS/BLUE SHIELD | Admitting: Family Medicine

## 2016-12-08 VITALS — BP 131/81 | HR 48 | Temp 97.9°F | Resp 16 | Ht 67.75 in | Wt 144.0 lb

## 2016-12-08 DIAGNOSIS — M2669 Other specified disorders of temporomandibular joint: Secondary | ICD-10-CM

## 2016-12-08 NOTE — Progress Notes (Signed)
Chief Complaint  Patient presents with  . Ear Fullness    hx of swimmers ear, ear feels stuffy but pt states she can hear.  Stopped wearing headphones for a week and it helped just recently started to wear them again.  ?  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 Abnormal EKG   . Allergy   . Chest pain     Current Outpatient Prescriptions  Medication Sig Dispense Refill  . azithromycin (ZITHROMAX) 250 MG tablet Take 2 tabs PO x 1 dose, then 1 tab PO QD x 4 days (Patient not taking: Reported on 12/08/2016) 6 tablet 0  . benzonatate (TESSALON) 100 MG capsule Take 1-2 capsules (100-200 mg total) by mouth 3 (three) times daily as needed for cough. (Patient not taking: Reported on 12/08/2016) 40 capsule 0  . cetirizine (ZYRTEC) 10 MG tablet Take 1 tablet (10 mg total) by mouth daily. (Patient not taking: Reported on 12/08/2016) 30 tablet 11  . fluticasone (FLONASE) 50 MCG/ACT nasal spray Place 2 sprays into both nostrils daily. (Patient not taking: Reported on 12/08/2016) 16 g 12  . Guaifenesin (MUCINEX MAXIMUM STRENGTH) 1200 MG TB12 Take 1 tablet (1,200 mg total) by mouth every 12 (twelve) hours as needed. (Patient not taking: Reported on 12/08/2016) 14 tablet 1  . triamcinolone cream (KENALOG) 0.1 % Apply 1 application topically 2 (two) times daily. (Patient not taking: Reported on 12/08/2016) 80 g 2   No current facility-administered medications for this visit.     Allergies: No Known Allergies  Past Surgical History:  Procedure Laterality Date  . SHOULDER SURGERY      Social History   Social History  . Marital status: Single    Spouse name: N/A  . Number of children: N/A  . Years of education: N/A   Social History Main Topics  . Smoking status: Never  Smoker  . Smokeless tobacco: Never Used  . Alcohol use 3.3 oz/week    3 Cans of beer, 3 Standard drinks or equivalent per week  . Drug use: No  . Sexual activity: Yes    Birth control/ protection: None     Comment: female partners   Other Topics Concern  . Not on file   Social History Narrative  . No narrative on file    ROS See hpi  Objective: Vitals:   12/08/16 1512  BP: 131/81  Pulse: (!) 48  Resp: 16  Temp: 97.9 F (36.6 C)  TempSrc: Oral  SpO2: 98%  Weight: 144 lb (65.3 kg)  Height: 5' 7.75" (1.721 m)    Physical Exam  Constitutional: She is oriented to person, place, and time. She appears well-developed and well-nourished.  HENT:  Head: Normocephalic and atraumatic.  Pt with crepitus of the TMJ worse on the R>L  Eyes: Conjunctivae and EOM are normal.  Cardiovascular: Normal rate, regular rhythm, normal heart sounds and intact distal pulses.   No murmur heard. Pulmonary/Chest: Effort normal and breath sounds normal. No respiratory distress. She has no wheezes.  Neurological: She is alert and oriented to person, place, and time.  Psychiatric: She has a normal mood and affect. Her behavior is normal. Judgment and thought content normal.    Assessment and Plan  Shelly Delacruz was seen today for ear fullness.  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

## 2016-12-08 NOTE — Patient Instructions (Addendum)
   IF you received an x-ray today, you will receive an invoice from [facility]. Please contact  Radiology at [phone] with questions or concerns regarding your invoice.   IF you received labwork today, you will receive an invoice from [facility]. Please contact [facility] at [phone] with questions or concerns regarding your invoice.   Our billing staff will not be able to assist you with questions regarding bills from these companies.  You will be contacted with the lab results as soon as they are available. The fastest way to get your results is to activate your My Chart account. Instructions are located on the last page of this paperwork. If you have not heard from us regarding the results in 2 weeks, please contact this office.     Temporomandibular Joint Syndrome Temporomandibular joint (TMJ) syndrome is a condition that affects the joints between your jaw and your skull. The TMJs are located near your ears and allow your jaw to open and close. These joints and the nearby muscles are involved in all movements of the jaw. People with TMJ syndrome have pain in the area of these joints and muscles. Chewing, biting, or other movements of the jaw can be difficult or painful. TMJ syndrome can be caused by various things. In many cases, the condition is mild and goes away within a few weeks. For some people, the condition can become a long-term problem. What are the causes? Possible causes of TMJ syndrome include:  Grinding your teeth or clenching your jaw. Some people do this when they are under stress.  Arthritis.  Injury to the jaw.  Head or neck injury.  Teeth or dentures that are not aligned well.  In some cases, the cause of TMJ syndrome may not be known. What are the signs or symptoms? The most common symptom is an aching pain on the side of the head in the area of the TMJ. Other symptoms may include:  Pain when moving your jaw, such as when chewing or  biting.  Being unable to open your jaw all the way.  Making a clicking sound when you open your mouth.  Headache.  Earache.  Neck or shoulder pain.  How is this diagnosed? Diagnosis can usually be made based on your symptoms, your medical history, and a physical exam. Your health care provider may check the range of motion of your jaw. Imaging tests, such as X-rays or an MRI, are sometimes done. You may need to see your dentist to determine if your teeth and jaw are lined up correctly. How is this treated? TMJ syndrome often goes away on its own. If treatment is needed, the options may include:  Eating soft foods and applying ice or heat.  Medicines to relieve pain or inflammation.  Medicines to relax the muscles.  A splint, bite plate, or mouthpiece to prevent teeth grinding or jaw clenching.  Relaxation techniques or counseling to help reduce stress.  Transcutaneous electrical nerve stimulation (TENS). This helps to relieve pain by applying an electrical current through the skin.  Acupuncture. This is sometimes helpful to relieve pain.  Jaw surgery. This is rarely needed.  Follow these instructions at home:  Take medicines only as directed by your health care provider.  Eat a soft diet if you are having trouble chewing.  Apply ice to the painful area. ? Put ice in a plastic bag. ? Place a towel between your skin and the bag. ? Leave the ice on for 20 minutes, 2-3   times a day.  Apply a warm compress to the painful area as directed.  Massage your jaw area and perform any jaw stretching exercises as recommended by your health care provider.  If you were given a mouthpiece or bite plate, wear it as directed.  Avoid foods that require a lot of chewing. Do not chew gum.  Keep all follow-up visits as directed by your health care provider. This is important. Contact a health care provider if:  You are having trouble eating.  You have new or worsening symptoms. Get  help right away if:  Your jaw locks open or closed. This information is not intended to replace advice given to you by your health care provider. Make sure you discuss any questions you have with your health care provider. Document Released: 12/07/2000 Document Revised: 11/12/2015 Document Reviewed: 10/17/2013 Elsevier Interactive Patient Education  2018 Elsevier Inc.  

## 2016-12-22 ENCOUNTER — Encounter: Payer: Self-pay | Admitting: Family Medicine

## 2016-12-22 ENCOUNTER — Ambulatory Visit (INDEPENDENT_AMBULATORY_CARE_PROVIDER_SITE_OTHER): Payer: BLUE CROSS/BLUE SHIELD | Admitting: Family Medicine

## 2016-12-22 VITALS — BP 102/84 | HR 56 | Temp 99.3°F | Resp 17 | Ht 67.75 in | Wt 144.0 lb

## 2016-12-22 DIAGNOSIS — S86112A Strain of other muscle(s) and tendon(s) of posterior muscle group at lower leg level, left leg, initial encounter: Secondary | ICD-10-CM | POA: Diagnosis not present

## 2016-12-22 DIAGNOSIS — M7662 Achilles tendinitis, left leg: Secondary | ICD-10-CM

## 2016-12-22 MED ORDER — NAPROXEN 500 MG PO TABS: 500.0000 mg | ORAL_TABLET | Freq: Two times a day (BID) | ORAL | 0 refills | Status: DC

## 2016-12-22 NOTE — Progress Notes (Signed)
  Chief Complaint  Patient presents with  . left leg pain    injured left leg playing bball on Monday, player ran into leg, no otc meds, icing and epson salt.  Pain level 5/10    HPI   Pt reports that she was playing basketball 3 days ago  She states that she kept playing to finish up the game after an initial injury when a player ran into her left leg She states that she iced the leg the next morning She had some soreness Yesterday she had soreness but ws able to do her usual routine Her symptoms improved with epsom salt soak She reports that she iced it once on 2 days ago   Past Medical History:  Diagnosis Date  . Abnormal EKG   . Allergy   . Chest pain     Current Outpatient Prescriptions  Medication Sig Dispense Refill  . naproxen (NAPROSYN) 500 MG tablet Take 1 tablet (500 mg total) by mouth 2 (two) times daily with a meal. 30 tablet 0   No current facility-administered medications for this visit.     Allergies: No Known Allergies  Past Surgical History:  Procedure Laterality Date  . SHOULDER SURGERY      Social History   Social History  . Marital status: Single    Spouse name: N/A  . Number of children: N/A  . Years of education: N/A   Social History Main Topics  . Smoking status: Never Smoker  . Smokeless tobacco: Never Used  . Alcohol use 3.3 oz/week    3 Cans of beer, 3 Standard drinks or equivalent per week  . Drug use: No  . Sexual activity: Yes    Birth control/ protection: None     Comment: female partners   Other Topics Concern  . None   Social History Narrative  . None    ROS  Objective: Vitals:   12/22/16 1458  BP: 102/84  Pulse: (!) 56  Resp: 17  Temp: 99.3 F (37.4 C)  TempSrc: Oral  SpO2: 98%  Weight: 144 lb (65.3 kg)  Height: 5' 7.75" (1.721 m)    Physical Exam  Constitutional: She is oriented to person, place, and time. She appears well-developed and well-nourished.  Pulmonary/Chest: Effort normal.    Musculoskeletal:       Left lower leg: She exhibits tenderness. She exhibits no bony tenderness, no swelling, no edema, no deformity and no laceration.  Neurological: She is alert and oriented to person, place, and time.  Skin: Skin is warm. Capillary refill takes less than 2 seconds.  Psychiatric: She has a normal mood and affect. Her behavior is normal. Judgment and thought content normal.   Thompson test negative Ecchymosis noted  Assessment and Plan Shelly Delacruz was seen today for left leg pain.  Diagnoses and all orders for this visit:  Tendonitis, Achilles, left Strain of gastrocnemius muscle of left lower extremity, initial encounter Advised NSAID and icing Stay out of sports for the next 3 days -     naproxen (NAPROSYN) 500 MG tablet; Take 1 tablet (500 mg total) by mouth 2 (two) times daily with a meal.     Shelly Delacruz A Shelly Delacruz

## 2016-12-22 NOTE — Patient Instructions (Addendum)
   IF you received an x-ray today, you will receive an invoice from [facility]. Please contact [facility] at [phone] with questions or concerns regarding your invoice.   IF you received labwork today, you will receive an invoice from [facility]. Please contact [facility] at [phone] with questions or concerns regarding your invoice.   Our billing staff will not be able to assist you with questions regarding bills from these companies.  You will be contacted with the lab results as soon as they are available. The fastest way to get your results is to activate your My Chart account. Instructions are located on the last page of this paperwork. If you have not heard from us regarding the results in 2 weeks, please contact this office.     Achilles Tendinitis Achilles tendinitis is inflammation of the tough, cord-like band that attaches the lower leg muscles to the heel bone (Achilles tendon). This is usually caused by overusing the tendon and the ankle joint. Achilles tendinitis usually gets better over time with treatment and caring for yourself at home. It can take weeks or months to heal completely. What are the causes? This condition may be caused by:  A sudden increase in exercise or activity, such as running.  Doing the same exercises or activities (such as jumping) over and over.  Not warming up calf muscles before exercising.  Exercising in shoes that are worn out or not made for exercise.  Having arthritis or a bone growth (spur) on the back of the heel bone. This can rub against the tendon and hurt it.  Age-related wear and tear. Tendons become less flexible with age and more likely to be injured.  What are the signs or symptoms? Common symptoms of this condition include:  Pain in the Achilles tendon or in the back of the leg, just above the heel. The pain usually gets worse with exercise.  Stiffness or soreness in the back of the leg, especially  in the morning.  Swelling of the skin over the Achilles tendon.  Thickening of the tendon.  Bone spurs at the bottom of the Achilles tendon, near the heel.  Trouble standing on tiptoe.  How is this diagnosed? This condition is diagnosed based on your symptoms and a physical exam. You may have tests, including:  X-rays.  MRI.  How is this treated? The goal of treatment is to relieve symptoms and help your injury heal. Treatment may include:  Decreasing or stopping activities that caused the tendinitis. This may mean switching to low-impact exercises like biking or swimming.  Icing the injured area.  Doing physical therapy, including strengthening and stretching exercises.  NSAIDs to help relieve pain and swelling.  Using supportive shoes, wraps, heel lifts, or a walking boot (air cast).  Surgery. This may be done if your symptoms do not improve after 6 months.  Using high-energy shock wave impulses to stimulate the healing process (extracorporeal shock wave therapy). This is rare.  Injection of medicines to help relieve inflammation (corticosteroids). This is rare.  Follow these instructions at home: If you have an air cast:  Wear the cast as told by your health care provider. Remove it only as told by your health care provider.  Loosen the cast if your toes tingle, become numb, or turn cold and blue. Activity  Gradually return to your normal activities once your health care provider approves. Do not do activities that cause pain. ? Consider doing low-impact exercises, like cycling or swimming.    If you have an air cast, ask your health care provider when it is safe for you to drive.  If physical therapy was prescribed, do exercises as told by your health care provider or physical therapist. Managing pain, stiffness, and swelling  Raise (elevate) your foot above the level of your heart while you are sitting or lying down.  Move your toes often to avoid stiffness  and to lessen swelling.  If directed, put ice on the injured area: ? Put ice in a plastic bag. ? Place a towel between your skin and the bag. ? Leave the ice on for 20 minutes, 2-3 times a day General instructions  If directed, wrap your foot with an elastic bandage or other wrap. This can help keep your tendon from moving too much while it heals. Your health care provider will show you how to wrap your foot correctly.  Wear supportive shoes or heel lifts only as told by your health care provider.  Take over-the-counter and prescription medicines only as told by your health care provider.  Keep all follow-up visits as told by your health care provider. This is important. Contact a health care provider if:  You have symptoms that gets worse.  You have pain that does not get better with medicine.  You develop new, unexplained symptoms.  You develop warmth and swelling in your foot.  You have a fever. Get help right away if:  You have a sudden popping sound or sensation in your Achilles tendon followed by severe pain.  You cannot move your toes or foot.  You cannot put any weight on your foot. Summary  Achilles tendinitis is inflammation of the tough, cord-like band that attaches the lower leg muscles to the heel bone (Achilles tendon).  This condition is usually caused by overusing the tendon and the ankle joint. It can also be caused by arthritis or normal aging.  The most common symptoms of this condition include pain, swelling, or stiffness in the Achilles tendon or in the back of the leg.  This condition is usually treated with rest, NSAIDs, and physical therapy. This information is not intended to replace advice given to you by your health care provider. Make sure you discuss any questions you have with your health care provider. Document Released: 12/22/2004 Document Revised: 02/01/2016 Document Reviewed: 02/01/2016 Elsevier Interactive Patient Education  2017  Elsevier Inc.  

## 2017-03-30 ENCOUNTER — Encounter: Payer: Self-pay | Admitting: Family Medicine

## 2017-03-30 ENCOUNTER — Ambulatory Visit: Payer: BLUE CROSS/BLUE SHIELD | Admitting: Family Medicine

## 2017-03-30 ENCOUNTER — Other Ambulatory Visit: Payer: Self-pay

## 2017-03-30 VITALS — BP 112/72 | HR 62 | Temp 98.8°F | Ht 67.72 in | Wt 148.0 lb

## 2017-03-30 DIAGNOSIS — J029 Acute pharyngitis, unspecified: Secondary | ICD-10-CM | POA: Diagnosis not present

## 2017-03-30 LAB — POCT RAPID STREP A (OFFICE): Rapid Strep A Screen: NEGATIVE

## 2017-03-30 MED ORDER — FLUTICASONE PROPIONATE 50 MCG/ACT NA SUSP: 2.0000 | Freq: Every day | NASAL | 6 refills | Status: DC

## 2017-03-30 MED ORDER — BENZONATATE 100 MG PO CAPS: 100.0000 mg | ORAL_CAPSULE | Freq: Two times a day (BID) | ORAL | 0 refills | Status: DC | PRN

## 2017-03-30 NOTE — Patient Instructions (Addendum)
IF you received an x-ray today, you will receive an invoice from [facility]. Please contact [facility] at [phone] with questions or concerns regarding your invoice.   IF you received labwork today, you will receive an invoice from [facility]. Please contact [facility] at [phone] with questions or concerns regarding your invoice.   Our billing staff will not be able to assist you with questions regarding bills from these companies.  You will be contacted with the lab results as soon as they are available. The fastest way to get your results is to activate your My Chart account. Instructions are located on the last page of this paperwork. If you have not heard from us regarding the results in 2 weeks, please contact this office.        IF you received an x-ray today, you will receive an invoice from [facility]. Please contact [facility] at [phone] with questions or concerns regarding your invoice.   IF you received labwork today, you will receive an invoice from [facility]. Please contact [facility] at [phone] with questions or concerns regarding your invoice.   Our billing staff will not be able to assist you with questions regarding bills from these companies.  You will be contacted with the lab results as soon as they are available. The fastest way to get your results is to activate your My Chart account. Instructions are located on the last page of this paperwork. If you have not heard from us regarding the results in 2 weeks, please contact this office.     Sore Throat A sore throat is pain, burning, irritation, or scratchiness in the throat. When you have a sore throat, you may feel pain or tenderness in your throat when you swallow or talk. Many things can cause a sore throat, including:  An infection.  Seasonal allergies.  Dryness in the air.  Irritants, such as smoke or pollution.  Gastroesophageal reflux disease  (GERD).  A tumor.  A sore throat is often the first sign of another sickness. It may happen with other symptoms, such as coughing, sneezing, fever, and swollen neck glands. Most sore throats go away without medical treatment. Follow these instructions at home:  Take over-the-counter medicines only as told by your health care provider.  Drink enough fluids to keep your urine clear or pale yellow.  Rest as needed.  To help with pain, try: ? Sipping warm liquids, such as broth, herbal tea, or warm water. ? Eating or drinking cold or frozen liquids, such as frozen ice pops. ? Gargling with a salt-water mixture 3-4 times a day or as needed. To make a salt-water mixture, completely dissolve -1 tsp of salt in 1 cup of warm water. ? Sucking on hard candy or throat lozenges. ? Putting a cool-mist humidifier in your bedroom at night to moisten the air. ? Sitting in the bathroom with the door closed for 5-10 minutes while you run hot water in the shower.  Do not use any tobacco products, such as cigarettes, chewing tobacco, and e-cigarettes. If you need help quitting, ask your health care provider. Contact a health care provider if:  You have a fever for more than 2-3 days.  You have symptoms that last (are persistent) for more than 2-3 days.  Your throat does not get better within 7 days.  You have a fever and your symptoms suddenly get worse. Get help right away if:  You have difficulty breathing.  You cannot swallow fluids, soft  foods, or your saliva.  You have increased swelling in your throat or neck.  You have persistent nausea and vomiting. This information is not intended to replace advice given to you by your health care provider. Make sure you discuss any questions you have with your health care provider. Document Released: 04/21/2004 Document Revised: 11/08/2015 Document Reviewed: 01/02/2015 Elsevier Interactive Patient Education  [employer].

## 2017-03-30 NOTE — Progress Notes (Signed)
1/3/201911:53 AM  Shelly Delacruz [birth year], 27 y.o. female [id number]  Chief Complaint  Patient presents with  . Sore Throat    CHILLS, COUGHING    HPI:   Patient is a 27 y.o. female who presents today for 3 days of sore throat, chills and today with mild cough, feels it is going down into her chest. Denies any fever or SOB. Endorses ear pressure and post nasal drip. Has been taking OTC meds with some relief.   Depression screen [facility] 2[facility] 03/30/2017 12/22/2016 12/08/2016  Decreased Interest 0 0 0  Down, Depressed, Hopeless 0 0 0  PHQ - 2 Score 0 0 0    No Known Allergies  Prior to Admission medications   Medication Sig Start Date End Date Taking? Authorizing Provider  naproxen (NAPROSYN) 500 MG tablet Take 1 tablet (500 mg total) by mouth 2 (two) times daily with a meal. Patient not taking: Reported on 03/30/2017 12/22/16   Doristine BosworthStallings, Zoe A, MD    Past Medical History:  Diagnosis Date  . Abnormal EKG   . Allergy   . Chest pain     Past Surgical History:  Procedure Laterality Date  . SHOULDER SURGERY      Social History   Tobacco Use  . Smoking status: Never Smoker  . Smokeless tobacco: Never Used  Substance Use Topics  . Alcohol use: Yes    Alcohol/week: 3.3 oz    Types: 3 Cans of beer, 3 Standard drinks or equivalent per week    Family History  Problem Relation Age of Onset  . Hypertension Mother   . Hyperlipidemia Mother     ROS Per hpi  OBJECTIVE:  Blood pressure 112/72, pulse 62, temperature 98.8 F (37.1 C), temperature source Oral, height 5' 7.72" (1.72 m), weight 148 lb (67.1 kg), SpO2 98 %.  Physical Exam  Constitutional: She is oriented to person, place, and time and well-developed, well-nourished, and in no distress.  HENT:  Head: Normocephalic and atraumatic.  Right Ear: Hearing, tympanic membrane, external ear and ear canal normal.  Left Ear: Hearing, tympanic membrane, external ear and ear canal normal.  Mouth/Throat: Oropharynx is clear  and moist.  Eyes: EOM are normal. Pupils are equal, round, and reactive to light.  Neck: Neck supple.  Cardiovascular: Normal rate, regular rhythm and normal heart sounds. Exam reveals no gallop and no friction rub.  No murmur heard. Pulmonary/Chest: Effort normal and breath sounds normal. She has no wheezes. She has no rales.  Lymphadenopathy:    She has no cervical adenopathy.  Neurological: She is alert and oriented to person, place, and time. Gait normal.  Skin: Skin is warm and dry.      Results for orders placed or performed in visit on 03/30/17 (from the past 24 hour(s))  POCT rapid strep A     Status: Normal   Collection Time: 03/30/17 12:14 PM  Result Value Ref Range   Rapid Strep A Screen Negative Negative      ASSESSMENT and PLAN  1. Sore throat Discussed supportive measures: increase hydration, rest, OTC medications, etc. RTC precautions discussed. - POCT rapid strep A  Other orders - fluticasone (FLONASE) 50 MCG/ACT nasal spray; Place 2 sprays into both nostrils daily. - benzonatate (TESSALON) 100 MG capsule; Take 1 capsule (100 mg total) by mouth 2 (two) times daily as needed for cough.  Return if symptoms worsen or fail to improve.    Shelly LippsIrma M Santiago, MD [facility] [address]  [place], [place] [address] Ph.  [phone] Fax [phone]

## 2017-04-19 ENCOUNTER — Encounter (HOSPITAL_COMMUNITY): Payer: Self-pay

## 2017-04-19 ENCOUNTER — Other Ambulatory Visit: Payer: Self-pay

## 2017-04-19 ENCOUNTER — Emergency Department (HOSPITAL_COMMUNITY)
Admission: EM | Admit: 2017-04-19 | Discharge: 2017-04-19 | Disposition: A | Discharge status: Home / Self Care | Payer: BLUE CROSS/BLUE SHIELD | Attending: Emergency Medicine | Admitting: Emergency Medicine

## 2017-04-19 DIAGNOSIS — Z23 Encounter for immunization: Secondary | ICD-10-CM | POA: Diagnosis not present

## 2017-04-19 DIAGNOSIS — X58XXXA Exposure to other specified factors, initial encounter: Secondary | ICD-10-CM | POA: Insufficient documentation

## 2017-04-19 DIAGNOSIS — Y999 Unspecified external cause status: Secondary | ICD-10-CM | POA: Insufficient documentation

## 2017-04-19 DIAGNOSIS — Z79899 Other long term (current) drug therapy: Secondary | ICD-10-CM | POA: Insufficient documentation

## 2017-04-19 DIAGNOSIS — Y9367 Activity, basketball: Secondary | ICD-10-CM | POA: Diagnosis not present

## 2017-04-19 DIAGNOSIS — S01511A Laceration without foreign body of lip, initial encounter: Secondary | ICD-10-CM | POA: Diagnosis not present

## 2017-04-19 DIAGNOSIS — Y929 Unspecified place or not applicable: Secondary | ICD-10-CM | POA: Insufficient documentation

## 2017-04-19 MED ORDER — PENICILLIN V POTASSIUM 500 MG PO TABS: 500.0000 mg | ORAL_TABLET | Freq: Four times a day (QID) | ORAL | 0 refills | Status: AC

## 2017-04-19 MED ORDER — TETANUS-DIPHTH-ACELL PERTUSSIS 5-2.5-18.5 LF-MCG/0.5 IM SUSP
0.5000 mL | Freq: Once | INTRAMUSCULAR | Status: AC
  Administered 2017-04-19: 0.5 mL via INTRAMUSCULAR
  Filled 2017-04-19: qty 0.5

## 2017-04-19 MED ORDER — PENICILLIN V POTASSIUM 250 MG PO TABS
500.0000 mg | ORAL_TABLET | Freq: Once | ORAL | Status: AC
Start: 2017-04-19 — End: 2017-04-19
  Administered 2017-04-19: 500 mg via ORAL
  Filled 2017-04-19: qty 2

## 2017-04-19 NOTE — ED Triage Notes (Signed)
Patient was playing basketball and got hit in the lip.

## 2017-04-19 NOTE — ED Provider Notes (Signed)
[facility] EMERGENCY DEPARTMENT Provider Note   CSN: [id number] Arrival date & time: 04/19/17  2053     History   Chief Complaint Chief Complaint  Patient presents with  . Lip Laceration    internal lower lip    HPI Shelly Delacruz is a 27 y.o. female presents today for evaluation of blood loss patient currently reports that she was playing basketball and the lip and has 2 cuts on her lower lip.  She denies any other injuries, did not strike her head or pass out, denies headache, neck pain, visual disturbance.  She does not take any blood thinning medications.  She did not have any seizure-like activity, has not vomited.  She is unsure when her last tetanus shot was.  She denies any possibility of pregnancy, denies any allergies to antibiotics.  HPI  Past Medical History:  Diagnosis Date  . Abnormal EKG   . Allergy   . Chest pain     There are no active problems to display for this patient.   Past Surgical History:  Procedure Laterality Date  . SHOULDER SURGERY      OB History    No data available       Home Medications    Prior to Admission medications   Medication Sig Start Date End Date Taking? Authorizing Provider  benzonatate (TESSALON) 100 MG capsule Take 1 capsule (100 mg total) by mouth 2 (two) times daily as needed for cough. 03/30/17   Myles Lipps, MD  fluticasone (FLONASE) 50 MCG/ACT nasal spray Place 2 sprays into both nostrils daily. 03/30/17   Myles Lipps, MD  naproxen (NAPROSYN) 500 MG tablet Take 1 tablet (500 mg total) by mouth 2 (two) times daily with a meal. Patient not taking: Reported on 03/30/2017 12/22/16   Doristine Bosworth, MD  penicillin v potassium (VEETID) 500 MG tablet Take 1 tablet (500 mg total) by mouth 4 (four) times daily for 7 days. 04/19/17 04/26/17  Cristina Gong, PA-C    Family History Family History  Problem Relation Age of Onset  . Hypertension Mother   . Hyperlipidemia Mother     Social  History Social History   Tobacco Use  . Smoking status: Never Smoker  . Smokeless tobacco: Never Used  Substance Use Topics  . Alcohol use: Yes    Alcohol/week: 3.3 oz    Types: 3 Cans of beer, 3 Standard drinks or equivalent per week  . Drug use: No     Allergies   Patient has no known allergies.   Review of Systems Review of Systems  Constitutional: Negative for chills and fever.  HENT: Negative for dental problem, drooling and facial swelling.   Skin: Positive for color change and wound.  Neurological: Negative for dizziness, weakness, numbness and headaches.  All other systems reviewed and are negative.    Physical Exam Updated Vital Signs BP 109/65 (BP Location: Right Arm)   Pulse (!) 58   Temp 98 F (36.7 C) (Oral)   Ht 5\' 8"  (1.727 m)   Wt 68 kg (150 lb)   SpO2 99%   BMI 22.81 kg/m   Physical Exam  Constitutional: She appears well-developed and well-nourished.  HENT:  Head: Normocephalic and atraumatic. Head is without raccoon's eyes and without Battle's sign.  Right Ear: Tympanic membrane, external ear and ear canal normal.  Left Ear: Tympanic membrane, external ear and ear canal normal.  Nose: Nose normal.  Mouth/Throat: Uvula is midline, oropharynx  is clear and moist and mucous membranes are normal. No oropharyngeal exudate.  Two sub centimeter lacerations on the mucosal surface of patient's lower lip.  There is oozing blood from these lacerations.  Lacerations extend through the mucosa.  All her teeth are firmly in place, not loose, not TTP.  No foreign bodies in lacerations.    Eyes: Pupils are equal, round, and reactive to light.  Neck: Normal range of motion. Neck supple.  Patient able to turn head past 45 degree bilaterally.  No midline or lateral TTP.   Neurological: She is alert.  Patient is awake and alert with normal speech, interacting appropriately, has recall of the situation.  No facial droop.   Skin: She is not diaphoretic.  Psychiatric:  She has a normal mood and affect. Her behavior is normal.  Nursing note and vitals reviewed.    ED Treatments / Results  Labs (all labs ordered are listed, but only abnormal results are displayed) Labs Reviewed - No data to display  EKG  EKG Interpretation None       Radiology No results found.  Procedures .Marland Kitchen.Laceration Repair Date/Time: 04/19/2017 10:42 PM Performed by: Cristina GongHammond, Leotta Weingarten W, PA-C Authorized by: Cristina GongHammond, Bernece Gall W, PA-C   Consent:    Consent obtained:  Verbal   Consent given by:  Patient   Risks discussed:  Infection, pain, poor wound healing, nerve damage, retained foreign body, vascular damage, tendon damage and poor cosmetic result   Alternatives discussed:  No treatment and observation (Patient would heal without sutures, however may continue to ooze and bleed less with suturing.) Anesthesia (see MAR for exact dosages):    Anesthesia method:  None (Discussed with patient that lidocaine would require at least 2 needle sticks and stinging and burning, she elected to have sutures placed without anesthesia.) Laceration details:    Location:  Lip   Lip location:  Lower interior lip   Wound length (cm): 2 subcentimeter lacerations. Repair type:    Repair type:  Simple Pre-procedure details:    Preparation:  Patient was prepped and draped in usual sterile fashion Exploration:    Hemostasis achieved with:  Direct pressure   Wound exploration: wound explored through full range of motion and entire depth of wound probed and visualized     Contaminated: yes   Treatment:    Area cleansed with:  Betadine   Amount of cleaning:  Standard Mucous membrane repair:    Suture size:  5-0   Suture material:  Vicryl   Suture technique:  Simple interrupted   Number of sutures:  2 Approximation:    Approximation:  Close   Vermilion border well-aligned: Vermilion border was not crossed.   Post-procedure details:    Dressing:  Open (no dressing)   Patient tolerance  of procedure:  Tolerated well, no immediate complications Comments:     Discussed with patient that suturing, while increasing the risk of infection, may help reduce the amount of oozing from her wounds   (including critical care time)  Medications Ordered in ED Medications  Tdap (BOOSTRIX) injection 0.5 mL (0.5 mLs Intramuscular Given 04/19/17 2212)  penicillin v potassium (VEETID) tablet 500 mg (500 mg Oral Given 04/19/17 2212)     Initial Impression / Assessment and Plan / ED Course  I have reviewed the triage vital signs and the nursing notes.  Pertinent labs & imaging results that were available during my care of the patient were reviewed by me and considered in my medical decision making (see  chart for details).    Dawayne Cirri presents today for evaluation of 2 subcentimeter lacerations on the inside of her lower lip that are oozing blood.  She does not have any headache or neck pain, denies loss of consciousness or confusion.  Denies any other injuries.  No obvious dental injuries, teeth are firmly in place without tenderness to palpation.  Risks/benefits of sutures were discussed with the patient, along with the potential risks/benefits of lidocaine.  Patient elected to proceed with sutures without lidocaine.  2 sutures were placed to close her lacerations.  She was given return precautions and states her understanding.  Tdap updated.  Will give her p.o. penicillin for infection prophylaxis.  OTC pain medicine as indicated, discharged home.  Final Clinical Impressions(s) / ED Diagnoses   Final diagnoses:  Laceration of lower lip, initial encounter    ED Discharge Orders        Ordered    penicillin v potassium (VEETID) 500 MG tablet  4 times daily     04/19/17 2207       Cristina Gong, PA-C 04/20/17 0202    Maia Plan, MD 04/20/17 [phone]

## 2017-04-19 NOTE — Discharge Instructions (Addendum)
Please take Ibuprofen (Advil, motrin) and Tylenol (acetaminophen) to relieve your pain.  You may take up to 600 MG (3 pills) of normal strength ibuprofen every 8 hours as needed.  In between doses of ibuprofen you make take tylenol, up to 1,000 mg (two extra strength pills).  Do not take more than 3,000 mg tylenol in a 24 hour period.  Please check all medication labels as many medications such as pain and cold medications may contain tylenol.  Do not drink alcohol while taking these medications.  Do not take other NSAID'S while taking ibuprofen (such as aleve or naproxen).  Please take ibuprofen with food to decrease stomach upset.  The stitches that were placed today are dissolvable.  If they have not fallen out after 7 days then please consider getting them removed.  If they fall out sooner that is okay.  You can expect that he will continue to have oozing and bleeding from your wound.  You may have diarrhea from the antibiotics.  It is very important that you continue to take the antibiotics even if you get diarrhea unless a medical professional tells you that you may stop taking them.  If you stop too early the bacteria you are being treated for will become stronger and you may need different, more powerful antibiotics that have more side effects and worsening diarrhea.  Please stay well hydrated and consider probiotics as they may decrease the severity of your diarrhea.  Please be aware that if you take any hormonal contraception (birth control pills, nexplanon, the ring, etc) that your birth control will not work while you are taking antibiotics and you need to use back up protection as directed on the birth control medication information insert.    If you do not have insurance and were given a prescription medication today, I suggest that you use a computer or smart phone to look up good Rx.  This is a discount drug coupon program that cannot be combined with insurance, however will greatly decrease  the cost of most medications.  This also allows you to compare prices at local pharmacies to find the lowest price.

## 2018-01-02 ENCOUNTER — Encounter: Payer: Self-pay | Admitting: Family Medicine

## 2018-01-02 ENCOUNTER — Ambulatory Visit: Payer: BLUE CROSS/BLUE SHIELD | Admitting: Family Medicine

## 2018-01-02 ENCOUNTER — Other Ambulatory Visit: Payer: Self-pay

## 2018-01-02 VITALS — BP 112/70 | HR 58 | Temp 98.2°F | Ht 68.0 in | Wt 156.2 lb

## 2018-01-02 DIAGNOSIS — J309 Allergic rhinitis, unspecified: Secondary | ICD-10-CM

## 2018-01-02 DIAGNOSIS — H6983 Other specified disorders of Eustachian tube, bilateral: Secondary | ICD-10-CM

## 2018-01-02 MED ORDER — FLUTICASONE PROPIONATE 50 MCG/ACT NA SUSP: 1.0000 | Freq: Every evening | NASAL | 6 refills | Status: AC | PRN

## 2018-01-02 NOTE — Patient Instructions (Addendum)
Previous ear symptoms were likely due to eustachian tube dysfunction or fluid behind the middle ear.  Ear exam is reassuring today.  There is a small amount of wax in the right side, but do not think that is causing any problems at this time.  If your ears do get completely blocked, return for recheck as that could be due to cerumen or wax and we can remove that in office.  For now I would recommend Flonase nasal spray 1 spray in each nostril using the technique we discussed at bedtime as needed.  See other information below.  Thank you for coming in today.  Return to the clinic or go to the nearest emergency room if any of your symptoms worsen or new symptoms occur.   Eustachian Tube Dysfunction The eustachian tube connects the middle ear to the back of the nose. It regulates air pressure in the middle ear by allowing air to move between the ear and nose. It also helps to drain fluid from the middle ear space. When the eustachian tube does not function properly, air pressure, fluid, or both can build up in the middle ear. Eustachian tube dysfunction can affect one or both ears. What are the causes? This condition happens when the eustachian tube becomes blocked or cannot open normally. This may result from:  Ear infections.  Colds and other upper respiratory infections.  Allergies.  Irritation, such as from cigarette smoke or acid from the stomach coming up into the esophagus (gastroesophageal reflux).  Sudden changes in air pressure, such as from descending in an airplane.  Abnormal growths in the nose or throat, such as nasal polyps, tumors, or enlarged tissue at the back of the throat (adenoids).  What increases the risk? This condition may be more likely to develop in people who smoke and people who are overweight. Eustachian tube dysfunction may also be more likely to develop in children, especially children who have:  Certain birth defects of the mouth, such as cleft  palate.  Large tonsils and adenoids.  What are the signs or symptoms? Symptoms of this condition may include:  A feeling of fullness in the ear.  Ear pain.  Clicking or popping noises in the ear.  Ringing in the ear.  Hearing loss.  Loss of balance.  Symptoms may get worse when the air pressure around you changes, such as when you travel to an area of high elevation or fly on an airplane. How is this diagnosed? This condition may be diagnosed based on:  Your symptoms.  A physical exam of your ear, nose, and throat.  Tests, such as those that measure: ? The movement of your eardrum (tympanogram). ? Your hearing (audiometry).  How is this treated? Treatment depends on the cause and severity of your condition. If your symptoms are mild, you may be able to relieve your symptoms by moving air into ("popping") your ears. If you have symptoms of fluid in your ears, treatment may include:  Decongestants.  Antihistamines.  Nasal sprays or ear drops that contain medicines that reduce swelling (steroids).  In some cases, you may need to have a procedure to drain the fluid in your eardrum (myringotomy). In this procedure, a small tube is placed in the eardrum to:  Drain the fluid.  Restore the air in the middle ear space.  Follow these instructions at home:  Take over-the-counter and prescription medicines only as told by your health care provider.  Use techniques to help pop your ears as  recommended by your health care provider. These may include: ? Chewing gum. ? Yawning. ? Frequent, forceful swallowing. ? Closing your mouth, holding your nose closed, and gently blowing as if you are trying to blow air out of your nose.  Do not do any of the following until your health care provider approves: ? Travel to high altitudes. ? Fly in airplanes. ? Work in a Estate agent or room. ? Scuba dive.  Keep your ears dry. Dry your ears completely after showering or  bathing.  Do not smoke.  Keep all follow-up visits as told by your health care provider. This is important. Contact a health care provider if:  Your symptoms do not go away after treatment.  Your symptoms come back after treatment.  You are unable to pop your ears.  You have: ? A fever. ? Pain in your ear. ? Pain in your head or neck. ? Fluid draining from your ear.  Your hearing suddenly changes.  You become very dizzy.  You lose your balance. This information is not intended to replace advice given to you by your health care provider. Make sure you discuss any questions you have with your health care provider. Document Released: 04/10/2015 Document Revised: 08/20/2015 Document Reviewed: 04/02/2014 Elsevier Interactive Patient Education  [employer].    If you have lab work done today you will be contacted with your lab results within the next 2 weeks.  If you have not heard from [place] then please contact us. The fastest way to get your results is to register for My Chart.   IF you received an x-ray today, you will receive an invoice from [facility]. Please contact [facility] at [phone] with questions or concerns regarding your invoice.   IF you received labwork today, you will receive an invoice from [facility]. Please contact [facility] at [phone] with questions or concerns regarding your invoice.   Our billing staff will not be able to assist you with questions regarding bills from these companies.  You will be contacted with the lab results as soon as they are available. The fastest way to get your results is to activate your My Chart account. Instructions are located on the last page of this paperwork. If you have not heard from [place] regarding the results in 2 weeks, please contact this office.

## 2018-01-02 NOTE — Progress Notes (Signed)
Subjective:  By signing my name below, I, Stann Delacruz, attest that this documentation has been prepared under the direction and in the presence of Meredith Staggers, MD. Electronically Signed: Stann Delacruz, Scribe. 01/02/2018 , 12:15 PM .  Patient was seen in Room 9 .   Patient ID: Shelly Delacruz, female    DOB: [birth year], 27 y.o.   MRN: [id number] Chief Complaint  Patient presents with  . ears blocked    both mostly left   HPI Shelly Delacruz is a 27 y.o. female Here with complaints of bilateral ear blockage, mostly on the left.   Patient states she can hear from her ears bilaterally but sounds have been muffled. She reports this started about 2 months ago, and eased up about a week ago but wanted to have it check it out. She has heard some popping and crackles in her ears. She denies having any cold symptoms at the start of this issue; notes cold symptoms started about a week ago. She has a history of seasonal allergies, and used to take Allegra, but not taking anything recently. In July, she rode on the airplane twice, but no problems at that time. She denies any scuba diving or pressure changing activity prior to symptoms starting. She has some congestion when she wakes up in the morning. She believes she had previously used Flonase nasal spray.   She mentions having swimmer's ear on the left 2 years ago. Previously, she was informed not to use cotton swab Q-tip in her ears. She's been cleaning her ears with a rag.   There are no active problems to display for this patient.  Past Medical History:  Diagnosis Date  . Abnormal EKG   . Allergy   . Chest pain    Past Surgical History:  Procedure Laterality Date  . SHOULDER SURGERY     No Known Allergies Prior to Admission medications   Medication Sig Start Date End Date Taking? Authorizing Provider  benzonatate (TESSALON) 100 MG capsule Take 1 capsule (100 mg total) by mouth 2 (two) times daily as needed for cough. 03/30/17    Myles Lipps, MD  fluticasone (FLONASE) 50 MCG/ACT nasal spray Place 2 sprays into both nostrils daily. 03/30/17   Myles Lipps, MD  naproxen (NAPROSYN) 500 MG tablet Take 1 tablet (500 mg total) by mouth 2 (two) times daily with a meal. Patient not taking: Reported on 03/30/2017 12/22/16   Doristine Bosworth, MD   Social History   Socioeconomic History  . Marital status: Single    Spouse name: Not on file  . Number of children: Not on file  . Years of education: Not on file  . Highest education level: Not on file  Occupational History  . Not on file  Social Needs  . Financial resource strain: Not on file  . Food insecurity:    Worry: Not on file    Inability: Not on file  . Transportation needs:    Medical: Not on file    Non-medical: Not on file  Tobacco Use  . Smoking status: Never Smoker  . Smokeless tobacco: Never Used  Substance and Sexual Activity  . Alcohol use: Yes    Alcohol/week: 6.0 standard drinks    Types: 3 Cans of beer, 3 Standard drinks or equivalent per week  . Drug use: No  . Sexual activity: Yes    Birth control/protection: None    Comment: female partners  Lifestyle  . Physical activity:  Days per week: Not on file    Minutes per session: Not on file  . Stress: Not on file  Relationships  . Social connections:    Talks on phone: Not on file    Gets together: Not on file    Attends religious service: Not on file    Active member of club or organization: Not on file    Attends meetings of clubs or organizations: Not on file    Relationship status: Not on file  . Intimate partner violence:    Fear of current or ex partner: Not on file    Emotionally abused: Not on file    Physically abused: Not on file    Forced sexual activity: Not on file  Other Topics Concern  . Not on file  Social History Narrative  . Not on file   Review of Systems  Constitutional: Negative for chills, fatigue, fever and unexpected weight change.  HENT: Positive for  hearing loss (muffled hearing). Negative for ear discharge and ear pain.   Respiratory: Negative for cough.   Gastrointestinal: Negative for constipation, diarrhea, nausea and vomiting.  Skin: Negative for rash and wound.  Neurological: Negative for dizziness, weakness and headaches.       Objective:   Physical Exam  Constitutional: She is oriented to person, place, and time. She appears well-developed and well-nourished. No distress.  HENT:  Head: Normocephalic and atraumatic.  Right Ear: Tympanic membrane and ear canal normal. Tympanic membrane is not erythematous. No middle ear effusion.  Left Ear: Tympanic membrane and ear canal normal.  Nose: Mucosal edema (R>L) present. Right sinus exhibits no maxillary sinus tenderness and no frontal sinus tenderness. Left sinus exhibits no maxillary sinus tenderness and no frontal sinus tenderness.  Right ear: moderate cerumen in the right canal, visualized TM is normal, no mid ear effusion present, no edema or erythema Left ear: canal is clear, visualized TM is normal  Eyes: Pupils are equal, round, and reactive to light. EOM are normal.  Neck: Neck supple.  Cardiovascular: Normal rate.  Pulmonary/Chest: Effort normal. No respiratory distress.  Musculoskeletal: Normal range of motion.  Neurological: She is alert and oriented to person, place, and time.  Skin: Skin is warm and dry.  Psychiatric: She has a normal mood and affect. Her behavior is normal.  Nursing note and vitals reviewed.   Vitals:   01/02/18 1157  BP: 112/70  Pulse: (!) 58  Temp: 98.2 F (36.8 C)  TempSrc: Oral  SpO2: 100%  Weight: 156 lb 3.2 oz (70.9 kg)  Height: 5\' 8"  (1.727 m)       Assessment & Plan:    [name] is a 27 y.o. female Allergic rhinitis, unspecified seasonality, unspecified trigger - Plan: fluticasone (FLONASE) 50 MCG/ACT nasal spray  Dysfunction of both eustachian tubes - Plan: fluticasone (FLONASE) 50 MCG/ACT nasal spray Suspected ETD,  now improved.   -flonase NS, handout given, RTC precautions.    Meds ordered this encounter  Medications  . fluticasone (FLONASE) 50 MCG/ACT nasal spray    Sig: Place 1 spray into both nostrils at bedtime as needed for allergies or rhinitis.    Dispense:  16 g    Refill:  6   Patient Instructions    Previous ear symptoms were likely due to eustachian tube dysfunction or fluid behind the middle ear.  Ear exam is reassuring today.  There is a small amount of wax in the right side, but do not think that is causing  any problems at this time.  If your ears do get completely blocked, return for recheck as that could be due to cerumen or wax and we can remove that in office.  For now I would recommend Flonase nasal spray 1 spray in each nostril using the technique we discussed at bedtime as needed.  See other information below.  Thank you for coming in today.  Return to the clinic or go to the nearest emergency room if any of your symptoms worsen or new symptoms occur.   Eustachian Tube Dysfunction The eustachian tube connects the middle ear to the back of the nose. It regulates air pressure in the middle ear by allowing air to move between the ear and nose. It also helps to drain fluid from the middle ear space. When the eustachian tube does not function properly, air pressure, fluid, or both can build up in the middle ear. Eustachian tube dysfunction can affect one or both ears. What are the causes? This condition happens when the eustachian tube becomes blocked or cannot open normally. This may result from:  Ear infections.  Colds and other upper respiratory infections.  Allergies.  Irritation, such as from cigarette smoke or acid from the stomach coming up into the esophagus (gastroesophageal reflux).  Sudden changes in air pressure, such as from descending in an airplane.  Abnormal growths in the nose or throat, such as nasal polyps, tumors, or enlarged tissue at the back of the  throat (adenoids).  What increases the risk? This condition may be more likely to develop in people who smoke and people who are overweight. Eustachian tube dysfunction may also be more likely to develop in children, especially children who have:  Certain birth defects of the mouth, such as cleft palate.  Large tonsils and adenoids.  What are the signs or symptoms? Symptoms of this condition may include:  A feeling of fullness in the ear.  Ear pain.  Clicking or popping noises in the ear.  Ringing in the ear.  Hearing loss.  Loss of balance.  Symptoms may get worse when the air pressure around you changes, such as when you travel to an area of high elevation or fly on an airplane. How is this diagnosed? This condition may be diagnosed based on:  Your symptoms.  A physical exam of your ear, nose, and throat.  Tests, such as those that measure: ? The movement of your eardrum (tympanogram). ? Your hearing (audiometry).  How is this treated? Treatment depends on the cause and severity of your condition. If your symptoms are Shelly, you may be able to relieve your symptoms by moving air into ("popping") your ears. If you have symptoms of fluid in your ears, treatment may include:  Decongestants.  Antihistamines.  Nasal sprays or ear drops that contain medicines that reduce swelling (steroids).  In some cases, you may need to have a procedure to drain the fluid in your eardrum (myringotomy). In this procedure, a small tube is placed in the eardrum to:  Drain the fluid.  Restore the air in the middle ear space.  Follow these instructions at home:  Take over-the-counter and prescription medicines only as told by your health care provider.  Use techniques to help pop your ears as recommended by your health care provider. These may include: ? Chewing gum. ? Yawning. ? Frequent, forceful swallowing. ? Closing your mouth, holding your nose closed, and gently blowing as if  you are trying to blow air out of your  nose.  Do not do any of the following until your health care provider approves: ? Travel to high altitudes. ? Fly in airplanes. ? Work in a Estate agent or room. ? Scuba dive.  Keep your ears dry. Dry your ears completely after showering or bathing.  Do not smoke.  Keep all follow-up visits as told by your health care provider. This is important. Contact a health care provider if:  Your symptoms do not go away after treatment.  Your symptoms come back after treatment.  You are unable to pop your ears.  You have: ? A fever. ? Pain in your ear. ? Pain in your head or neck. ? Fluid draining from your ear.  Your hearing suddenly changes.  You become very dizzy.  You lose your balance. This information is not intended to replace advice given to you by your health care provider. Make sure you discuss any questions you have with your health care provider. Document Released: 04/10/2015 Document Revised: 08/20/2015 Document Reviewed: 04/02/2014 Elsevier Interactive Patient Education  [employer].    If you have lab work done today you will be contacted with your lab results within the next 2 weeks.  If you have not heard from [place] then please contact us. The fastest way to get your results is to register for My Chart.   IF you received an x-ray today, you will receive an invoice from [facility]. Please contact [facility] at [phone] with questions or concerns regarding your invoice.   IF you received labwork today, you will receive an invoice from [facility]. Please contact [facility] at [phone] with questions or concerns regarding your invoice.   Our billing staff will not be able to assist you with questions regarding bills from these companies.  You will be contacted with the lab results as soon as they are available. The fastest way to get your results is to activate your My Chart account. Instructions  are located on the last page of this paperwork. If you have not heard from [place] regarding the results in 2 weeks, please contact this office.       I personally performed the services described in this documentation, which was scribed in my presence. The recorded information has been reviewed and considered for accuracy and completeness, addended by me as needed, and agree with information above.  Signed,   Meredith Staggers, MD [facility].  01/02/18 2:46 PM

## 2018-03-06 ENCOUNTER — Ambulatory Visit: Payer: BLUE CROSS/BLUE SHIELD | Admitting: Family Medicine

## 2019-08-08 DIAGNOSIS — S86011A Strain of right Achilles tendon, initial encounter: Secondary | ICD-10-CM

## 2019-08-08 HISTORY — DX: Strain of right Achilles tendon, initial encounter: S86.011A

## 2022-05-24 ENCOUNTER — Ambulatory Visit: Payer: 59 | Attending: Cardiology | Admitting: Cardiology

## 2022-05-24 VITALS — BP 114/72 | HR 47 | Ht 68.0 in | Wt 168.1 lb

## 2022-05-24 DIAGNOSIS — R079 Chest pain, unspecified: Secondary | ICD-10-CM | POA: Insufficient documentation

## 2022-05-24 DIAGNOSIS — R0602 Shortness of breath: Secondary | ICD-10-CM

## 2022-05-24 DIAGNOSIS — R011 Cardiac murmur, unspecified: Secondary | ICD-10-CM

## 2022-05-24 DIAGNOSIS — R9431 Abnormal electrocardiogram [ECG] [EKG]: Secondary | ICD-10-CM

## 2022-05-24 DIAGNOSIS — T7840XA Allergy, unspecified, initial encounter: Secondary | ICD-10-CM | POA: Insufficient documentation

## 2022-05-24 HISTORY — DX: Chest pain, unspecified: R07.9

## 2022-05-24 HISTORY — DX: Cardiac murmur, unspecified: R01.1

## 2022-05-24 HISTORY — DX: Shortness of breath: R06.02

## 2022-05-24 NOTE — Patient Instructions (Signed)
Medication Instructions:  Your physician recommends that you continue on your current medications as directed. Please refer to the Current Medication list given to you today.  *If you need a refill on your cardiac medications before your next appointment, please call your pharmacy*   Lab Work: None ordered If you have labs (blood work) drawn today and your tests are completely normal, you will receive your results only by: Shelly Delacruz (if you have MyChart) OR A paper copy in the mail If you have any lab test that is abnormal or we need to change your treatment, we will call you to review the results.   Testing/Procedures:      Stress Echocardiogram Information Sheet                                                      Instructions:    1. You may take your morning medications the morning of the test  2. Light breakfast.  3. Dress prepared to exercise.  4. DO NOT use ANY caffeine or tobacco products 3 hours before appointment.  5. Please bring all current prescription medications.   Your physician has requested that you have an echocardiogram. Echocardiography is a painless test that uses sound waves to create images of your heart. It provides your doctor with information about the size and shape of your heart and how well your heart's chambers and valves are working. This procedure takes approximately one hour. There are no restrictions for this procedure. Please do NOT wear cologne, perfume, aftershave, or lotions (deodorant is allowed). Please arrive 15 minutes prior to your appointment time.   Follow-Up: At [facility], you and your health needs are our priority.  As part of our continuing mission to provide you with exceptional heart care, we have created designated Provider Care Teams.  These Care Teams include your primary Cardiologist (physician) and Advanced Practice Providers (APPs -  Physician Assistants and Nurse Practitioners) who all work together to provide  you with the care you need, when you need it.  We recommend signing up for the patient portal called "MyChart".  Sign up information is provided on this After Visit Summary.  MyChart is used to connect with patients for Virtual Visits (Telemedicine).  Patients are able to view lab/test results, encounter notes, upcoming appointments, etc.  Non-urgent messages can be sent to your provider as well.   To learn more about what you can do with MyChart, go to [email or web address].    Your next appointment:   As needed  The format for your next appointment:   In Person  Provider:   Jyl Heinz, MD   Other Instructions Exercise Stress Echocardiogram An exercise stress echocardiogram is a test to check how well your heart is working. This test uses sound waves and a computer to make pictures of your heart. These pictures will be taken before and after you exercise. For this test, you will walk on a treadmill or ride a bicycle to make your heart beat faster. While you exercise, your heart will be checked with an electrocardiogram (ECG). Your blood pressure will also be checked. You may have this test if: You have chest pain or a heart problem. You had a heart attack or heart surgery not long ago. You have heart valve problems. You have a condition that causes narrowing  of the blood vessels that supply your heart. You have a high risk of heart disease and: You are starting a new exercise program. You need to have a big surgery. Tell a doctor about: Any allergies you have. All medicines you are taking. This includes vitamins, herbs, eye drops, creams, and over-the-counter medicines. Any problems you or family members have had with medicines that make you fall asleep (anesthetic medicines). Any surgeries you have had. Any blood disorders you have. Any medical conditions you have. Whether you are pregnant or may be pregnant. What are the risks? Generally, this is a safe test. However,  problems may occur, including: Chest pain. Feeling dizzy or light-headed. Shortness of breath. Increased or irregular heartbeat. Feeling like you may vomit (nausea) or vomiting. Heart attack. This is very rare. What happens before the test? Medicines Ask your doctor about changing or stopping your normal medicines. This is important if you take diabetes medicines or blood thinners. If you use an inhaler, bring it to the test. General instructions Wear comfortable clothes and walking shoes. Follow instructions from your doctor about what you cannot eat or drink before the test. Do not drink or eat anything that has caffeine in it. Stop having caffeine 24 hours before the test. Do not smoke or use products that contain nicotine or tobacco for 4 hours before the test. If you need help quitting, ask your doctor. What happens during the test?  You will take off your clothes from the waist up and put on a hospital gown. Electrodes or patches will be put on your chest. A blood pressure cuff will be put on your arm. Before you exercise, a computer will make a picture of your heart. To do this: You will lie down and a gel will be put on your chest. A wand will be moved over the gel. Sound waves from the wand will go to the computer to make the picture. Then, you will start to exercise. You may walk on a treadmill or pedal a bicycle. Your blood pressure and heart rhythm will be checked while you exercise. The exercise will get harder or faster. You will exercise until: Your heart reaches a certain level. You are too tired to go on. You cannot go on because of chest pain, weakness, or dizziness. You will lie down right away so another picture of your heart can be taken. The procedure may vary among doctors and hospitals. What can I expect after the test? After your test, it is common to have: Mild soreness. Mild tiredness. Your heart rate and blood pressure will be checked until they  return to your normal levels. You should not have any new symptoms after this test. Follow these instructions at home: If your doctor says that you can, you may: Eat what you normally eat. Do your normal activities. Take over-the-counter and prescription medicines only as told by your doctor. Keep all follow-up visits. It is up to you to get the results of your test. Ask how to get your results when they are ready. Contact a doctor if: You feel dizzy or light-headed. You have a fast or irregular heartbeat. You feel like you may vomit or you vomit. You have a headache. You feel short of breath. Get help right away if: You develop pain or pressure: In your chest. In your jaw or neck. Between your shoulders. That goes down your left arm. You faint. You have trouble breathing. These symptoms may be an emergency. Get medical help  right away. Call your local emergency services (911 in the [place] Do not wait to see if the symptoms will go away. Do not drive yourself to the hospital. Summary This is a test that checks how well your heart is working. Follow instructions about what you cannot eat or drink before the test. Ask your doctor if you should take your normal medicines before the test. Stop having caffeine 24 hours before the test. Do not smoke or use products with nicotine or tobacco in them for 4 hours before the test. During the test, your blood pressure and heart rhythm will be checked while you exercise. This information is not intended to replace advice given to you by your health care provider. Make sure you discuss any questions you have with your health care provider. Document Revised: 11/25/2020 Document Reviewed: 11/05/2019 Elsevier Patient Education  [place].  Echocardiogram An echocardiogram is a test that uses sound waves (ultrasound) to produce images of the heart. Images from an echocardiogram can provide important information about: Heart size and  shape. The size and thickness and movement of your heart's walls. Heart muscle function and strength. Heart valve function or if you have stenosis. Stenosis is when the heart valves are too narrow. If blood is flowing backward through the heart valves (regurgitation). A tumor or infectious growth around the heart valves. Areas of heart muscle that are not working well because of poor blood flow or injury from a heart attack. Aneurysm detection. An aneurysm is a weak or damaged part of an artery wall. The wall bulges out from the normal force of blood pumping through the body. Tell a health care provider about: Any allergies you have. All medicines you are taking, including vitamins, herbs, eye drops, creams, and over-the-counter medicines. Any blood disorders you have. Any surgeries you have had. Any medical conditions you have. Whether you are pregnant or may be pregnant. What are the risks? Generally, this is a safe test. However, problems may occur, including an allergic reaction to dye (contrast) that may be used during the test. What happens before the test? No specific preparation is needed. You may eat and drink normally. What happens during the test?  You will take off your clothes from the waist up and put on a hospital gown. Electrodes or electrocardiogram (ECG)patches may be placed on your chest. The electrodes or patches are then connected to a device that monitors your heart rate and rhythm. You will lie down on a table for an ultrasound exam. A gel will be applied to your chest to help sound waves pass through your skin. A handheld device, called a transducer, will be pressed against your chest and moved over your heart. The transducer produces sound waves that travel to your heart and bounce back (or "echo" back) to the transducer. These sound waves will be captured in real-time and changed into images of your heart that can be viewed on a video monitor. The images will be  recorded on a computer and reviewed by your health care provider. You may be asked to change positions or hold your breath for a short time. This makes it easier to get different views or better views of your heart. In some cases, you may receive contrast through an IV in one of your veins. This can improve the quality of the pictures from your heart. The procedure may vary among health care providers and hospitals. What can I expect after the test? You may return to your  normal, everyday life, including diet, activities, and medicines, unless your health care provider tells you not to do that. Follow these instructions at home: It is up to you to get the results of your test. Ask your health care provider, or the department that is doing the test, when your results will be ready. Keep all follow-up visits. This is important. Summary An echocardiogram is a test that uses sound waves (ultrasound) to produce images of the heart. Images from an echocardiogram can provide important information about the size and shape of your heart, heart muscle function, heart valve function, and other possible heart problems. You do not need to do anything to prepare before this test. You may eat and drink normally. After the echocardiogram is completed, you may return to your normal, everyday life, unless your health care provider tells you not to do that. This information is not intended to replace advice given to you by your health care provider. Make sure you discuss any questions you have with your health care provider. Document Revised: 11/25/2020 Document Reviewed: 11/05/2019 Elsevier Patient Education  [place].

## 2022-05-24 NOTE — Progress Notes (Signed)
Cardiology Office Note:    Date:  05/24/2022   ID:  Shelly Delacruz, DOB [birth year], MRN [id number]  PCP:  Patient, No Pcp Per  Cardiologist:  Jenean Lindau, MD   Referring MD: Chauncy Lean*    ASSESSMENT:    1. Abnormal EKG   2. Chest pain, unspecified type   3. Cardiac murmur    PLAN:    In order of problems listed above:  Primary prevention stressed with the patient.  Importance of compliance with diet medication stressed and she vocalized understanding. Cardiac murmur: Echocardiogram will be done to assess murmur heard on auscultation. Chest pain: Atypical in nature with hardly any risk factors.  She is concerned about it and wants to be reassured so we will do an exercise stress echo and she is agreeable. I reviewed her lipids and they are fine.  They were done sometime in the past this is followed by primary care.  She will be seen in follow-up appointment on a as needed basis.   Medication Adjustments/Labs and Tests Ordered: Current medicines are reviewed at length with the patient today.  Concerns regarding medicines are outlined above.  Orders Placed This Encounter  Procedures   EKG 12-Lead   ECHOCARDIOGRAM COMPLETE   ECHOCARDIOGRAM STRESS TEST   No orders of the defined types were placed in this encounter.    History of Present Illness:    Shelly Delacruz is a 32 y.o. female who is being seen today for the evaluation of chest pain at the request of Myna Bright, P*.  Patient is a pleasant 32 year old female.  She has no past medical history that is significant.  She denies any history of hypertension dyslipidemia or diabetes mellitus.  She is an active lady.  She has had Achilles tendon rupture and has recovered from it and walks on a regular basis.  At the time of my evaluation, the patient is alert awake oriented and in no distress.  She occasionally has chest pain not related to exertion.  No radiation to the neck or to the arms.  She is here  for further evaluation.  Past Medical History:  Diagnosis Date   Abnormal EKG    Achilles rupture, right 08/08/2019   Formatting of this note might be different from the original. Added automatically from request for surgery [id number]   Allergy    Chest pain 05/24/2022   Routine health maintenance 03/20/2011   Shortness of breath 05/24/2022    Past Surgical History:  Procedure Laterality Date   ACHILLES TENDON SURGERY     SHOULDER SURGERY      Current Medications: Current Meds  Medication Sig   fluticasone (FLONASE) 50 MCG/ACT nasal spray Place 1 spray into both nostrils at bedtime as needed for allergies or rhinitis.     Allergies:   Patient has no known allergies.   Social History   Socioeconomic History   Marital status: Single    Spouse name: Not on file   Number of children: Not on file   Years of education: Not on file   Highest education level: Not on file  Occupational History   Not on file  Tobacco Use   Smoking status: Never   Smokeless tobacco: Never  Vaping Use   Vaping Use: Never used  Substance and Sexual Activity   Alcohol use: Yes    Alcohol/week: 6.0 standard drinks of alcohol    Types: 3 Cans of beer, 3 Standard drinks or equivalent per  week   Drug use: No   Sexual activity: Yes    Birth control/protection: None    Comment: female partners  Other Topics Concern   Not on file  Social History Narrative   Not on file   Social Determinants of Health   Financial Resource Strain: Not on file  Food Insecurity: Not on file  Transportation Needs: Not on file  Physical Activity: Not on file  Stress: Not on file  Social Connections: Not on file     Family History: The patient's family history includes Hyperlipidemia in her mother; Hypertension in her mother.  ROS:   Please see the history of present illness.    All other systems reviewed and are negative.  EKGs/Labs/Other Studies Reviewed:    The following studies were reviewed today: EKG  reveals sinus rhythm LVH and nonspecific ST-T changes   Recent Labs: No results found for requested labs within last 365 days.  Recent Lipid Panel    Component Value Date/Time   CHOL 148 05/30/2016 0859   TRIG 35 05/30/2016 0859   HDL 88 05/30/2016 0859   CHOLHDL 1.7 05/30/2016 0859   CHOLHDL 2 06/27/2011 1020   VLDL 6.6 06/27/2011 1020   LDLCALC 53 05/30/2016 0859    Physical Exam:    VS:  BP 114/72   Pulse (!) 47   Ht '5\' 8"'$  (1.727 m)   Wt 168 lb 1.9 oz (76.3 kg)   SpO2 98%   BMI 25.56 kg/m     Wt Readings from Last 3 Encounters:  05/24/22 168 lb 1.9 oz (76.3 kg)  01/02/18 156 lb 3.2 oz (70.9 kg)  04/19/17 150 lb (68 kg)     GEN: Patient is in no acute distress HEENT: Normal NECK: No JVD; No carotid bruits LYMPHATICS: No lymphadenopathy CARDIAC: S1 S2 regular, 2/6 systolic murmur at the apex. RESPIRATORY:  Clear to auscultation without rales, wheezing or rhonchi  ABDOMEN: Soft, non-tender, non-distended MUSCULOSKELETAL:  No edema; No deformity  SKIN: Warm and dry NEUROLOGIC:  Alert and oriented x 3 PSYCHIATRIC:  Normal affect    Signed, Jenean Lindau, MD  05/24/2022 9:52 AM    [place]

## 2022-05-31 ENCOUNTER — Telehealth (HOSPITAL_COMMUNITY): Payer: Self-pay | Admitting: *Deleted

## 2022-05-31 NOTE — Telephone Encounter (Signed)
Per DPR left detailed instructions for stress echo scheduled on 06/02/22.

## 2022-06-02 ENCOUNTER — Ambulatory Visit (HOSPITAL_BASED_OUTPATIENT_CLINIC_OR_DEPARTMENT_OTHER): Payer: 59

## 2022-06-02 ENCOUNTER — Ambulatory Visit (HOSPITAL_COMMUNITY): Payer: 59 | Attending: Cardiology

## 2022-06-02 DIAGNOSIS — R079 Chest pain, unspecified: Secondary | ICD-10-CM | POA: Diagnosis present

## 2022-06-02 DIAGNOSIS — R9431 Abnormal electrocardiogram [ECG] [EKG]: Secondary | ICD-10-CM | POA: Diagnosis present

## 2022-06-02 LAB — ECHOCARDIOGRAM STRESS TEST
AR max vel: 3.36 cm2
AV Area VTI: 3.24 cm2
AV Area mean vel: 3.18 cm2
AV Mean grad: 5 mmHg
AV Peak grad: 10.2 mmHg
Ao pk vel: 1.6 m/s
Area-P 1/2: 3.59 cm2
S' Lateral: 2.8 cm

## 2022-06-02 MED ORDER — PERFLUTREN LIPID MICROSPHERE
1.0000 mL | INTRAVENOUS | Status: AC | PRN
  Administered 2022-06-02 (×3): 2 mL via INTRAVENOUS

## 2022-06-16 ENCOUNTER — Ambulatory Visit (HOSPITAL_COMMUNITY): Payer: 59 | Attending: Cardiology

## 2022-06-16 ENCOUNTER — Telehealth: Payer: Self-pay

## 2022-06-16 DIAGNOSIS — R9431 Abnormal electrocardiogram [ECG] [EKG]: Secondary | ICD-10-CM | POA: Diagnosis not present

## 2022-06-16 DIAGNOSIS — R079 Chest pain, unspecified: Secondary | ICD-10-CM | POA: Diagnosis not present

## 2022-06-16 LAB — ECHOCARDIOGRAM COMPLETE
Area-P 1/2: 2.97 cm2
S' Lateral: 2.8 cm

## 2022-06-16 NOTE — Telephone Encounter (Signed)
Results given to the patient through my cart and unable to faxed to pcp(none on file)

## 2022-06-16 NOTE — Telephone Encounter (Signed)
-----   Message from Jenean Lindau, MD sent at 06/16/2022  1:41 PM EDT ----- The results of the study is unremarkable. Please inform patient. I will discuss in detail at next appointment. Cc  primary care/referring physician Jenean Lindau, MD 06/16/2022 1:40 PM

## 2023-10-09 ENCOUNTER — Ambulatory Visit: Attending: Cardiology

## 2023-10-09 ENCOUNTER — Telehealth: Payer: Self-pay | Admitting: Cardiology

## 2023-10-09 DIAGNOSIS — R002 Palpitations: Secondary | ICD-10-CM

## 2023-10-09 NOTE — Telephone Encounter (Signed)
 PT reports increased heart rate and palpations. Appointment made and zio ordered to be applied 10/23/23. Pt verbalized understanding and had no additional questions.

## 2023-10-09 NOTE — Telephone Encounter (Signed)
 Patient c/o Palpitations: STAT if patient c/o lightheadedness, shortness of breath, or chest pain  How long have you had palpitations/irregular HR/ Afib? Are you having the symptoms now?  Palpitations + CP around 2:30 AM. Went to work, symptoms resolved then came back. She went to medical services at work. She informed her HR is irregular + follow up with doctor  Are you currently experiencing lightheadedness, SOB or CP?  No   Do you have a history of afib (atrial fibrillation) or irregular heart rhythm?  No   Have you checked your BP or HR? (document readings if available):  HR is low, but readings aren't with her   Are you experiencing any other symptoms?  Chest pain coming and going since around 2:30 AM, not currently. Tingling down left side earlier.

## 2023-11-16 ENCOUNTER — Ambulatory Visit: Payer: Self-pay | Admitting: Cardiology

## 2023-11-16 DIAGNOSIS — R002 Palpitations: Secondary | ICD-10-CM | POA: Diagnosis not present

## 2023-11-22 ENCOUNTER — Ambulatory Visit: Attending: Cardiology | Admitting: Cardiology

## 2023-11-22 ENCOUNTER — Encounter: Payer: Self-pay | Admitting: Cardiology

## 2023-11-22 VITALS — BP 110/68 | HR 56 | Ht 68.0 in | Wt 164.0 lb

## 2023-11-22 DIAGNOSIS — R002 Palpitations: Secondary | ICD-10-CM | POA: Diagnosis not present

## 2023-11-22 NOTE — Progress Notes (Signed)
 Cardiology Office Note:    Date:  11/22/2023   ID:  Shelly Delacruz, DOB Jun 16, 1990, MRN [id number]  PCP:  Patient, No Pcp Per  Cardiologist:  Jennifer JONELLE Crape, MD   Referring MD: No ref. provider found    ASSESSMENT:    1. Palpitations    PLAN:    In order of problems listed above:  Primary prevention stressed with the patient.  Importance of compliance with diet medication stressed and patient verbalized standing. 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  I discussed findings on the monitor with the patient extensively.  Results of stress echo and echocardiogram are also daily.  Educated about symptoms of high-grade AV block and she understands and questions were answered to her satisfaction Palpitations: These have resolved. Patient will be seen in follow-up appointment in 6 months or earlier if the patient has any concerns.    Medication Adjustments/Labs and Tests Ordered: Current medicines are reviewed at length with the patient today.  Concerns regarding medicines are outlined above.  Orders Placed This Encounter  Procedures   EKG 12-Lead   No orders of the defined types were placed in this encounter.    No chief complaint on file.    History of Present Illness:    Shelly Delacruz is a 33 y.o. female.  Patient has past medical history of palpitations.  Subsequently she has had no issues.  She is very active lately.  No chest pain orthopnea or PND.  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  Again no dizziness syncope or any such symptoms.  She is a very active lady and exercises and play basketball on a regular basis.  At the time of my evaluation, the patient is alert awake oriented and in no distress.  Past Medical History:  Diagnosis Date   Abnormal EKG    Achilles rupture, right 08/08/2019   Formatting of this note might be different from the original. Added automatically from request for surgery [id number]    Allergy    Cardiac murmur 05/24/2022   Chest pain 05/24/2022   Routine health maintenance 03/20/2011   Shortness of breath 05/24/2022    Past Surgical History:  Procedure Laterality Date   ACHILLES TENDON SURGERY     SHOULDER SURGERY      Current Medications: Current Meds  Medication Sig   fluticasone  (FLONASE ) 50 MCG/ACT nasal spray Place 1 spray into both nostrils at bedtime as needed for allergies or rhinitis.     Allergies:   Patient has no known allergies.   Social History   Socioeconomic History   Marital status: Single    Spouse name: Not on file   Number of children: Not on file   Years of education: Not on file   Highest education level: Not on file  Occupational History   Not on file  Tobacco Use   Smoking status: Never   Smokeless tobacco: Never  Vaping Use   Vaping status: Never Used  Substance and Sexual Activity   Alcohol use: Yes    Alcohol/week: 6.0 standard drinks of alcohol    Types: 3 Cans of beer, 3 Standard drinks or equivalent per week   Drug use: No   Sexual activity: Yes    Birth control/protection: None    Comment: female partners  Other Topics Concern   Not on file  Social History Narrative   Not on file   Social Drivers of Health  Financial Resource Strain: Not on file  Food Insecurity: Low Risk  (12/02/2022)   Received from [facility]   Hunger Vital Sign    Within the past 12 months, you worried that your food would run out before you got money to buy more: Never true    Within the past 12 months, the food you bought just didn't last and you didn't have money to get more. : Never true  Transportation Needs: No Transportation Needs (12/02/2022)   Received from [facility]    In the past 12 months, has lack of reliable transportation kept you from medical appointments, meetings, work or from getting things needed for daily living? : No  Physical Activity: Not on file  Stress: Not on file  Social  Connections: Not on file     Family History: The patient's family history includes Hyperlipidemia in her mother; Hypertension in her mother.  ROS:   Please see the history of present illness.    All other systems reviewed and are negative.  EKGs/Labs/Other Studies Reviewed:    The following studies were reviewed today: .SABRAEKG Interpretation Date/Time:  Wednesday November 22 2023 13:26:27 EDT Ventricular Rate:  56 PR Interval:  170 QRS Duration:  90 QT Interval:  408 QTC Calculation: 393 R Axis:   92  Text Interpretation: Sinus bradycardia with sinus arrhythmia Rightward axis Biventricular hypertrophy Confirmed by Edwyna Backers [phone]) on 11/22/2023 1:35:37 PM     Recent Labs: No results found for requested labs within last 365 days.  Recent Lipid Panel    Component Value Date/Time   CHOL 148 05/30/2016 0859   TRIG 35 05/30/2016 0859   HDL 88 05/30/2016 0859   CHOLHDL 1.7 05/30/2016 0859   CHOLHDL 2 06/27/2011 1020   VLDL 6.6 06/27/2011 1020   LDLCALC 53 05/30/2016 0859    Physical Exam:    VS:  BP 110/68   Pulse (!) 56   Ht 5' 8 (1.727 m)   Wt 164 lb (74.4 kg)   SpO2 99%   BMI 24.94 kg/m     Wt Readings from Last 3 Encounters:  11/22/23 164 lb (74.4 kg)  05/24/22 168 lb 1.9 oz (76.3 kg)  01/02/18 156 lb 3.2 oz (70.9 kg)     GEN: Patient is in no acute distress HEENT: Normal NECK: No JVD; No carotid bruits LYMPHATICS: No lymphadenopathy CARDIAC: Hear sounds regular, 2/6 systolic murmur at the apex. RESPIRATORY:  Clear to auscultation without rales, wheezing or rhonchi  ABDOMEN: Soft, non-tender, non-distended MUSCULOSKELETAL:  No edema; No deformity  SKIN: Warm and dry NEUROLOGIC:  Alert and oriented x 3 PSYCHIATRIC:  Normal affect   Signed, Backers JONELLE Edwyna, MD  11/22/2023 1:36 PM    [facility]

## 2023-11-22 NOTE — Patient Instructions (Signed)
